# Patient Record
Sex: Female | Born: 1970 | Race: White | Hispanic: No | Marital: Married | Smoking: Never smoker
Health system: Southern US, Community
[De-identification: ages and names within clinical notes are randomized; demographics above are authoritative.]

## PROBLEM LIST (undated history)

## (undated) DIAGNOSIS — R011 Cardiac murmur, unspecified: Secondary | ICD-10-CM

## (undated) DIAGNOSIS — K8020 Calculus of gallbladder without cholecystitis without obstruction: Secondary | ICD-10-CM

## (undated) HISTORY — DX: Calculus of gallbladder without cholecystitis without obstruction: K80.20

## (undated) HISTORY — DX: Cardiac murmur, unspecified: R01.1

---

## 2002-12-20 ENCOUNTER — Other Ambulatory Visit: Admission: RE | Admit: 2002-12-20 | Discharge: 2002-12-20 | Payer: Self-pay | Admitting: Obstetrics and Gynecology

## 2004-01-16 ENCOUNTER — Other Ambulatory Visit: Admission: RE | Admit: 2004-01-16 | Discharge: 2004-01-16 | Payer: Self-pay | Admitting: Obstetrics and Gynecology

## 2004-07-28 ENCOUNTER — Inpatient Hospital Stay (HOSPITAL_COMMUNITY): Admission: AD | Admit: 2004-07-28 | Discharge: 2004-07-30 | Payer: Self-pay | Admitting: Obstetrics and Gynecology

## 2010-03-17 ENCOUNTER — Other Ambulatory Visit: Payer: Self-pay | Admitting: Obstetrics and Gynecology

## 2010-03-17 DIAGNOSIS — Z1239 Encounter for other screening for malignant neoplasm of breast: Secondary | ICD-10-CM

## 2010-04-08 ENCOUNTER — Ambulatory Visit
Admission: RE | Admit: 2010-04-08 | Discharge: 2010-04-08 | Disposition: A | Discharge status: Home / Self Care | Payer: BC Managed Care – PPO | Source: Ambulatory Visit | Attending: Obstetrics and Gynecology | Admitting: Obstetrics and Gynecology

## 2010-04-08 DIAGNOSIS — Z1239 Encounter for other screening for malignant neoplasm of breast: Secondary | ICD-10-CM

## 2010-04-08 IMAGING — MG MM DIGITAL SCREENING {BCG}
5 series · 5 of 5 positions shown · non-contrast
Comparison: none

DG SCREEN MAMMOGRAM BILATERAL
Bilateral CC and MLO view(s) were taken.

DIGITAL SCREENING MAMMOGRAM WITH CAD:
There are scattered fibroglandular densities.  A possible mass with calcification is noted in the 
left breast.  Spot compression views and possibly sonography are recommended for further 
evaluation.  The right breast is unremarkable.
Images were processed with CAD.

[R CC]
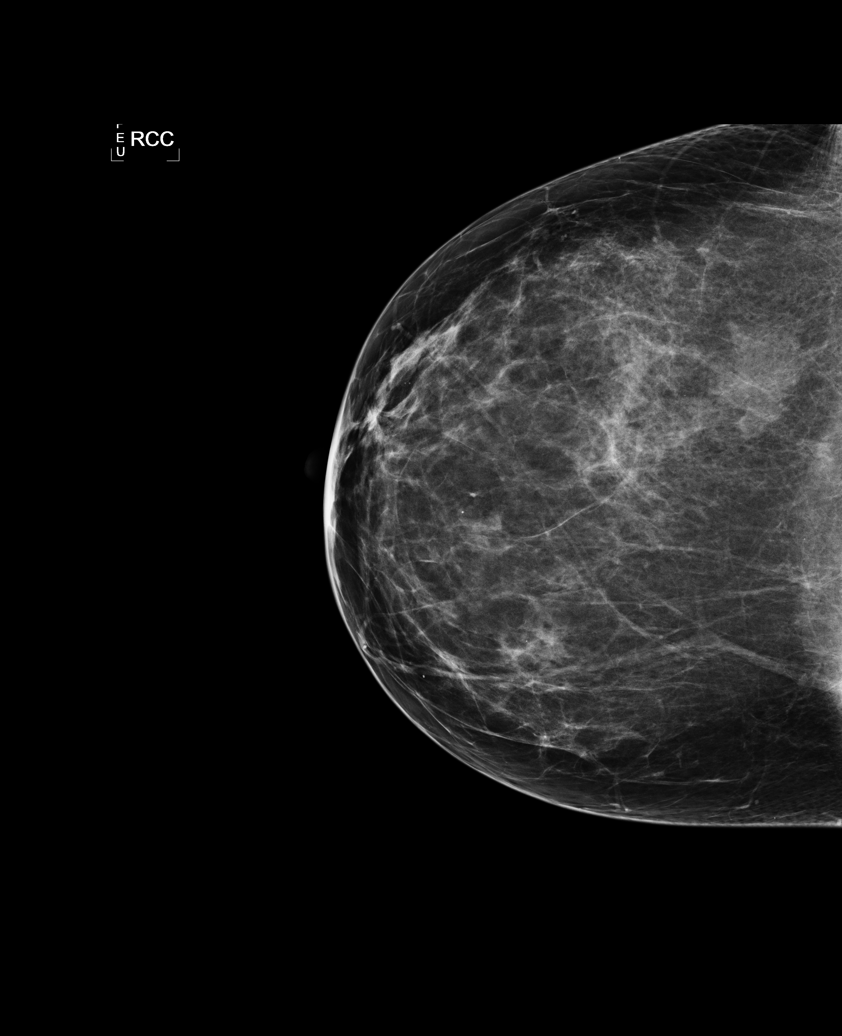

[L CC]
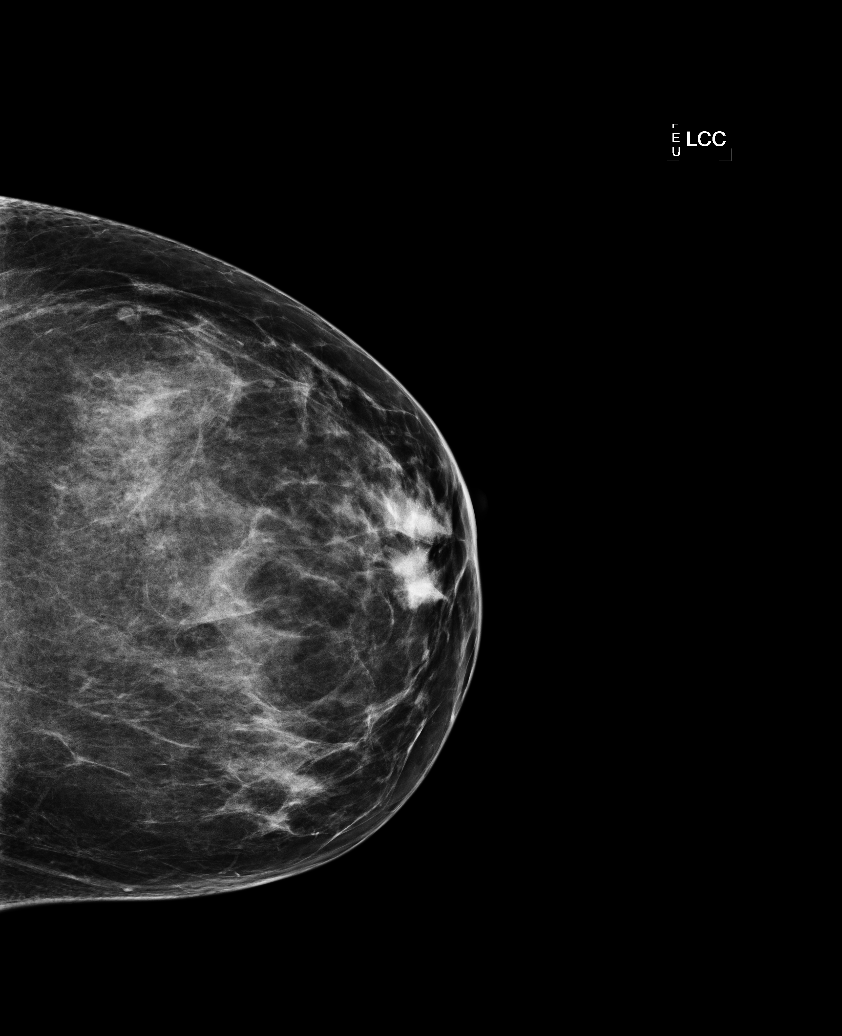

[L MLO]
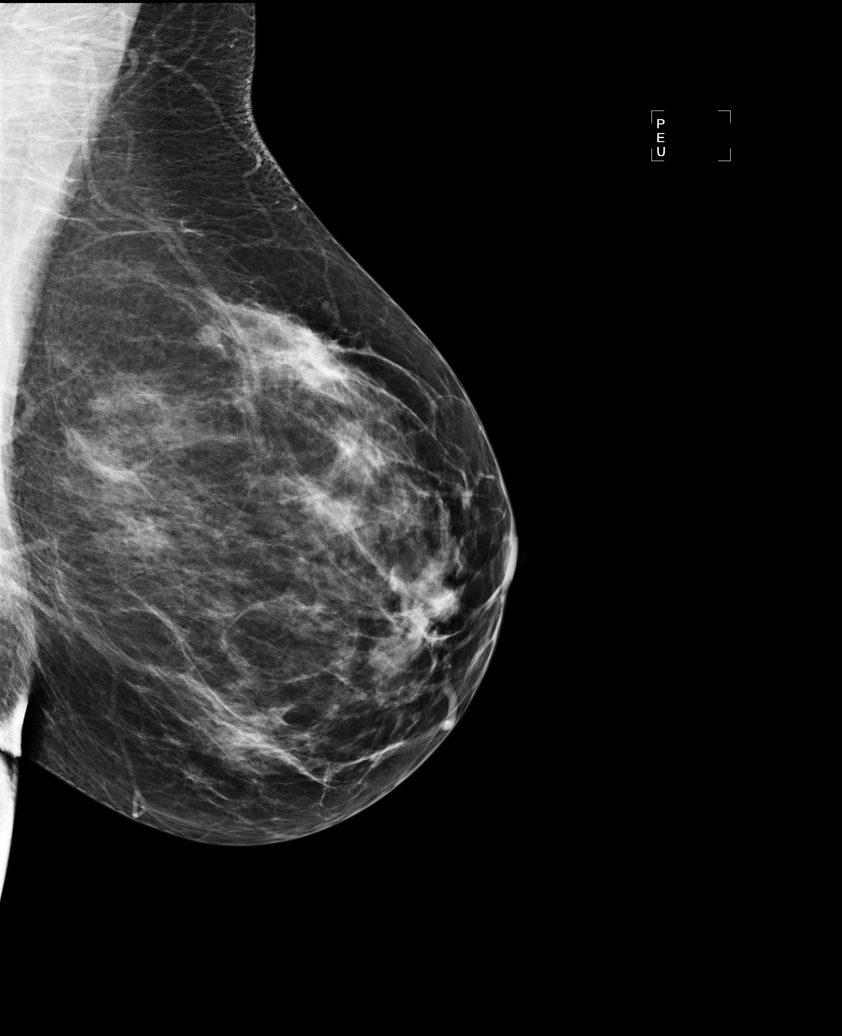

[R MLO (1 of 2)]
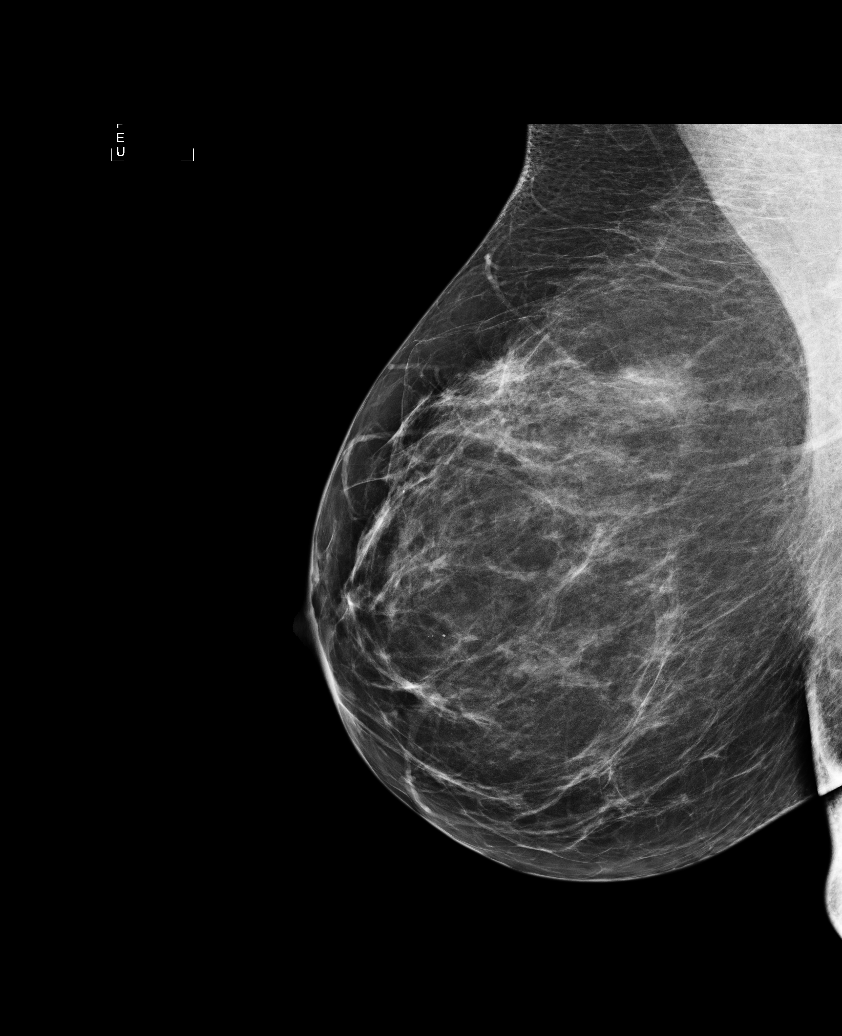

[R MLO (2 of 2)]
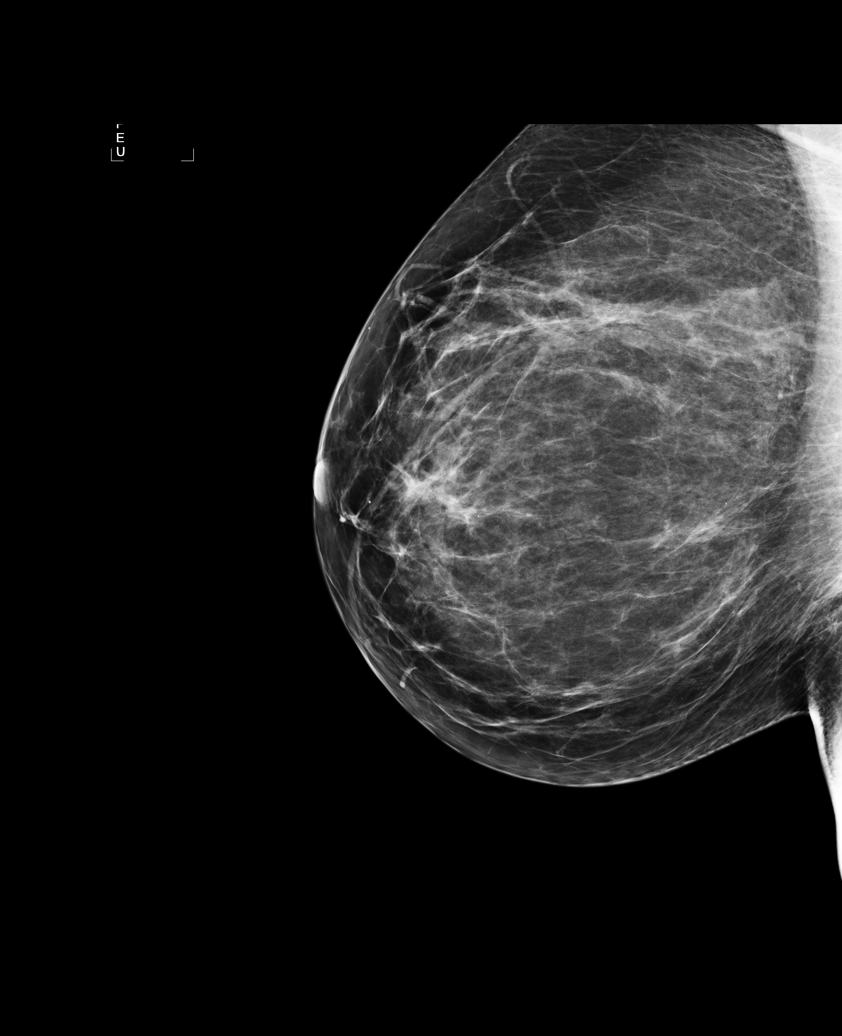

[5 of 5 positions shown; findings below may reference images not displayed]

IMPRESSION: Possible mass with calcification, left breast.  Additional evaluation is indicated.  The patient 
will be contacted for additional studies and a supplemental report will follow.

ASSESSMENT: Need additional imaging evaluation and/or prior mammograms for comparison - BI-RADS 0 -
Left

Further imaging of the left breast.
,

## 2010-04-09 ENCOUNTER — Other Ambulatory Visit: Payer: Self-pay | Admitting: Obstetrics and Gynecology

## 2010-04-09 DIAGNOSIS — R928 Other abnormal and inconclusive findings on diagnostic imaging of breast: Secondary | ICD-10-CM

## 2010-04-15 ENCOUNTER — Ambulatory Visit
Admission: RE | Admit: 2010-04-15 | Discharge: 2010-04-15 | Disposition: A | Discharge status: Home / Self Care | Payer: BC Managed Care – PPO | Source: Ambulatory Visit | Attending: Obstetrics and Gynecology | Admitting: Obstetrics and Gynecology

## 2010-04-15 DIAGNOSIS — R928 Other abnormal and inconclusive findings on diagnostic imaging of breast: Secondary | ICD-10-CM

## 2011-04-10 ENCOUNTER — Other Ambulatory Visit: Payer: Self-pay | Admitting: Obstetrics and Gynecology

## 2011-04-10 DIAGNOSIS — Z1231 Encounter for screening mammogram for malignant neoplasm of breast: Secondary | ICD-10-CM

## 2011-04-30 ENCOUNTER — Ambulatory Visit
Admission: RE | Admit: 2011-04-30 | Discharge: 2011-04-30 | Disposition: A | Discharge status: Home / Self Care | Payer: BC Managed Care – PPO | Source: Ambulatory Visit | Attending: Obstetrics and Gynecology | Admitting: Obstetrics and Gynecology

## 2011-04-30 DIAGNOSIS — Z1231 Encounter for screening mammogram for malignant neoplasm of breast: Secondary | ICD-10-CM

## 2011-08-14 ENCOUNTER — Encounter (INDEPENDENT_AMBULATORY_CARE_PROVIDER_SITE_OTHER): Payer: Self-pay

## 2011-09-07 ENCOUNTER — Ambulatory Visit (INDEPENDENT_AMBULATORY_CARE_PROVIDER_SITE_OTHER): Payer: BC Managed Care – PPO | Admitting: General Surgery

## 2011-10-02 ENCOUNTER — Encounter (INDEPENDENT_AMBULATORY_CARE_PROVIDER_SITE_OTHER): Payer: Self-pay | Admitting: General Surgery

## 2011-10-02 ENCOUNTER — Ambulatory Visit (INDEPENDENT_AMBULATORY_CARE_PROVIDER_SITE_OTHER): Payer: BC Managed Care – PPO | Admitting: General Surgery

## 2011-10-02 VITALS — BP 118/72 | HR 68 | Temp 96.9°F | Resp 16 | Ht 67.0 in | Wt 171.2 lb

## 2011-10-02 DIAGNOSIS — K8020 Calculus of gallbladder without cholecystitis without obstruction: Secondary | ICD-10-CM | POA: Insufficient documentation

## 2011-10-02 NOTE — Assessment & Plan Note (Addendum)
Pt with asymptomatic cholelithiasis.  Will observe.   Reviewed symptoms of gallbladder disease and advised pt to call if problems occur.   Pt had elevation of AST and ALT but this was independent of Alk Phos and TBili.   Given gallbladder educational material.   Pt is at slightly higher risk of needed cholecystectomy since both parents required it, but if completely asymptomatic, risks outweigh benefits of cholecystectomy.

## 2011-10-02 NOTE — Progress Notes (Signed)
Chief Complaint  Patient presents with  . Cholelithiasis    HISTORY: Patient is a 41 year old female who had an adverse drug reaction following dental surgery. She took hydrocodone and got palpitations, shakiness, and edginess. She had labs drawn at that time and was demonstrated to have an elevated AST and ALT. A right upper quadrant ultrasound was performed to evaluate the elevation in LFTs. No masses were seen and no biliary obstruction was seen.  She did however have small shadowing mobile gallstones.  She denies any history of abdominal pain, nausea, acholic stools, or dark urine. She has not experienced jaundice. She denies bloating and reflux. She denies any achiness in her upper abdomen. She denies fevers and chills. Of note, both her mother and her father required cholecystectomy for gallbladder disease.  Past Medical History  Diagnosis Date  . Gallstones   . Heart murmur     History reviewed. No pertinent past surgical history.  No current outpatient prescriptions on file.     No Known Allergies   Family History  Problem Relation Age of Onset  . Cancer Mother     breast     History   Social History  . Marital Status: Married    Spouse Name: N/A    Number of Children: N/A  . Years of Education: N/A   Social History Main Topics  . Smoking status: Never Smoker   . Smokeless tobacco: Never Used  . Alcohol Use: No  . Drug Use: No  . Sexually Active: None    REVIEW OF SYSTEMS - PERTINENT POSITIVES ONLY: 12 point review of systems negative other than HPI and PMH  EXAM: Filed Vitals:   10/02/11 1050  BP: 118/72  Pulse: 68  Temp: 96.9 F (36.1 C)  Resp: 16    Gen:  No acute distress.  Well nourished and well groomed.   Neurological: Alert and oriented to person, place, and time. Coordination normal.  Head: Normocephalic and atraumatic.  Eyes: Conjunctivae are normal. Pupils are equal, round, and reactive to light. No scleral icterus.  Neck: Normal  range of motion. Neck supple. No tracheal deviation or thyromegaly present.  Cardiovascular: Normal rate, regular rhythm, normal heart sounds and intact distal pulses.  Exam reveals no gallop and no friction rub.  No murmur heard. Respiratory: Effort normal.  No respiratory distress. No chest wall tenderness. Breath sounds normal.  No wheezes, rales or rhonchi.  GI: Soft. Bowel sounds are normal. The abdomen is soft and nontender.  There is no rebound and no guarding.  Musculoskeletal: Normal range of motion. Extremities are nontender.  Lymphadenopathy: No cervical, preauricular, postauricular or axillary adenopathy is present Skin: Skin is warm and dry. No rash noted. No diaphoresis. No erythema. No pallor. No clubbing, cyanosis, or edema.   Psychiatric: Normal mood and affect. Behavior is normal. Judgment and thought content normal.    LABORATORY RESULTS: Available labs are reviewed  Scanned in epic.    RADIOLOGY RESULTS: See E-Chart or I-Site for most recent results.  Images and reports are reviewed. Ultrasound with small shadowing gallstones without wall thickening or pericholecystic fluid.  No sonographic murphy's sign.     ASSESSMENT AND PLAN: Cholelithiasis Pt with asymptomatic cholelithiasis.  Will observe.   Reviewed symptoms of gallbladder disease and advised pt to call if problems occur.   Pt had elevation of AST and ALT but this was independent of Alk Phos and TBili.   Given gallbladder educational material.   Pt is at slightly higher risk  of needed cholecystectomy since both parents required it, but if completely asymptomatic, risks outweigh benefits of cholecystectomy.       Maudry Diego MD Surgical Oncology, General and Endocrine Surgery [facility]      Visit Diagnoses: 1. Cholelithiasis     Primary Care Physician: Herb Grays, MD

## 2011-10-02 NOTE — Patient Instructions (Signed)
Call for follow up appointment if you develop upper abdominal pain or nausea after eating.  Low fat diet decreases likelihood of gallbladder symptoms.

## 2012-05-31 ENCOUNTER — Other Ambulatory Visit: Payer: Self-pay

## 2012-05-31 DIAGNOSIS — Z1231 Encounter for screening mammogram for malignant neoplasm of breast: Secondary | ICD-10-CM

## 2012-06-27 ENCOUNTER — Ambulatory Visit
Admission: RE | Admit: 2012-06-27 | Discharge: 2012-06-27 | Disposition: A | Discharge status: Home / Self Care | Payer: BC Managed Care – PPO | Source: Ambulatory Visit

## 2012-06-27 DIAGNOSIS — Z1231 Encounter for screening mammogram for malignant neoplasm of breast: Secondary | ICD-10-CM

## 2013-05-29 ENCOUNTER — Other Ambulatory Visit: Payer: Self-pay

## 2013-05-29 DIAGNOSIS — Z1231 Encounter for screening mammogram for malignant neoplasm of breast: Secondary | ICD-10-CM

## 2013-07-07 ENCOUNTER — Encounter (INDEPENDENT_AMBULATORY_CARE_PROVIDER_SITE_OTHER): Payer: Self-pay

## 2013-07-07 ENCOUNTER — Ambulatory Visit
Admission: RE | Admit: 2013-07-07 | Discharge: 2013-07-07 | Disposition: A | Discharge status: Home / Self Care | Payer: BC Managed Care – PPO | Source: Ambulatory Visit

## 2013-07-07 DIAGNOSIS — Z1231 Encounter for screening mammogram for malignant neoplasm of breast: Secondary | ICD-10-CM

## 2014-06-01 ENCOUNTER — Other Ambulatory Visit: Payer: Self-pay

## 2014-06-01 DIAGNOSIS — Z1231 Encounter for screening mammogram for malignant neoplasm of breast: Secondary | ICD-10-CM

## 2014-07-10 ENCOUNTER — Ambulatory Visit
Admission: RE | Admit: 2014-07-10 | Discharge: 2014-07-10 | Disposition: A | Discharge status: Home / Self Care | Payer: BLUE CROSS/BLUE SHIELD | Source: Ambulatory Visit

## 2014-07-10 DIAGNOSIS — Z1231 Encounter for screening mammogram for malignant neoplasm of breast: Secondary | ICD-10-CM

## 2015-06-24 ENCOUNTER — Other Ambulatory Visit: Payer: Self-pay

## 2015-06-24 DIAGNOSIS — Z1231 Encounter for screening mammogram for malignant neoplasm of breast: Secondary | ICD-10-CM

## 2015-07-16 ENCOUNTER — Ambulatory Visit
Admission: RE | Admit: 2015-07-16 | Discharge: 2015-07-16 | Disposition: A | Discharge status: Home / Self Care | Payer: BLUE CROSS/BLUE SHIELD | Source: Ambulatory Visit

## 2015-07-16 DIAGNOSIS — Z1231 Encounter for screening mammogram for malignant neoplasm of breast: Secondary | ICD-10-CM

## 2016-06-09 ENCOUNTER — Other Ambulatory Visit: Payer: Self-pay | Admitting: Obstetrics and Gynecology

## 2016-06-09 DIAGNOSIS — Z1231 Encounter for screening mammogram for malignant neoplasm of breast: Secondary | ICD-10-CM

## 2016-07-22 ENCOUNTER — Ambulatory Visit
Admission: RE | Admit: 2016-07-22 | Discharge: 2016-07-22 | Disposition: A | Discharge status: Home / Self Care | Payer: BLUE CROSS/BLUE SHIELD | Source: Ambulatory Visit | Attending: Obstetrics and Gynecology | Admitting: Obstetrics and Gynecology

## 2016-07-22 DIAGNOSIS — Z1231 Encounter for screening mammogram for malignant neoplasm of breast: Secondary | ICD-10-CM

## 2017-07-19 ENCOUNTER — Other Ambulatory Visit: Payer: Self-pay | Admitting: Obstetrics and Gynecology

## 2017-07-19 DIAGNOSIS — Z1231 Encounter for screening mammogram for malignant neoplasm of breast: Secondary | ICD-10-CM

## 2017-08-10 ENCOUNTER — Ambulatory Visit
Admission: RE | Admit: 2017-08-10 | Discharge: 2017-08-10 | Disposition: A | Discharge status: Home / Self Care | Payer: BLUE CROSS/BLUE SHIELD | Source: Ambulatory Visit | Attending: Obstetrics and Gynecology | Admitting: Obstetrics and Gynecology

## 2017-08-10 DIAGNOSIS — Z1231 Encounter for screening mammogram for malignant neoplasm of breast: Secondary | ICD-10-CM

## 2017-08-10 IMAGING — MG DIGITAL SCREENING BILATERAL MAMMOGRAM WITH CAD
5 series · 5 of 5 positions shown · non-contrast
Comparison: Previous exam(s).

CLINICAL DATA: Screening.

EXAM:
DIGITAL SCREENING BILATERAL MAMMOGRAM WITH CAD

[L MLO (1 of 2)]
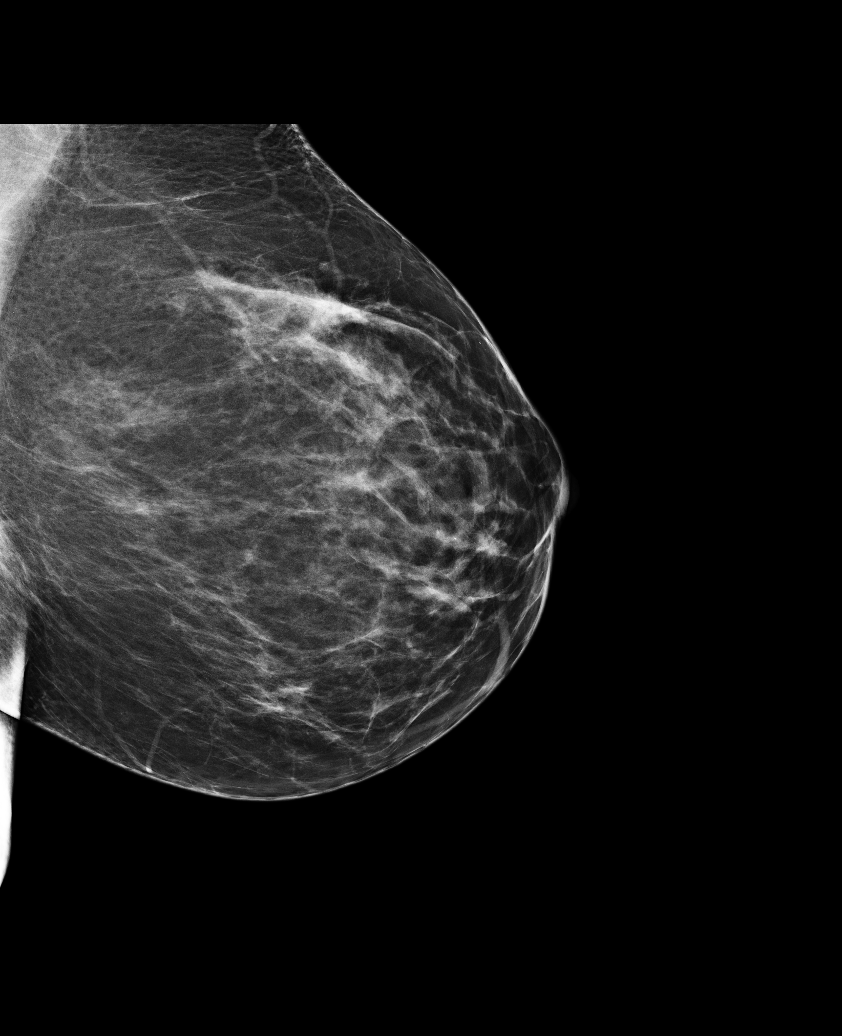

[R MLO]
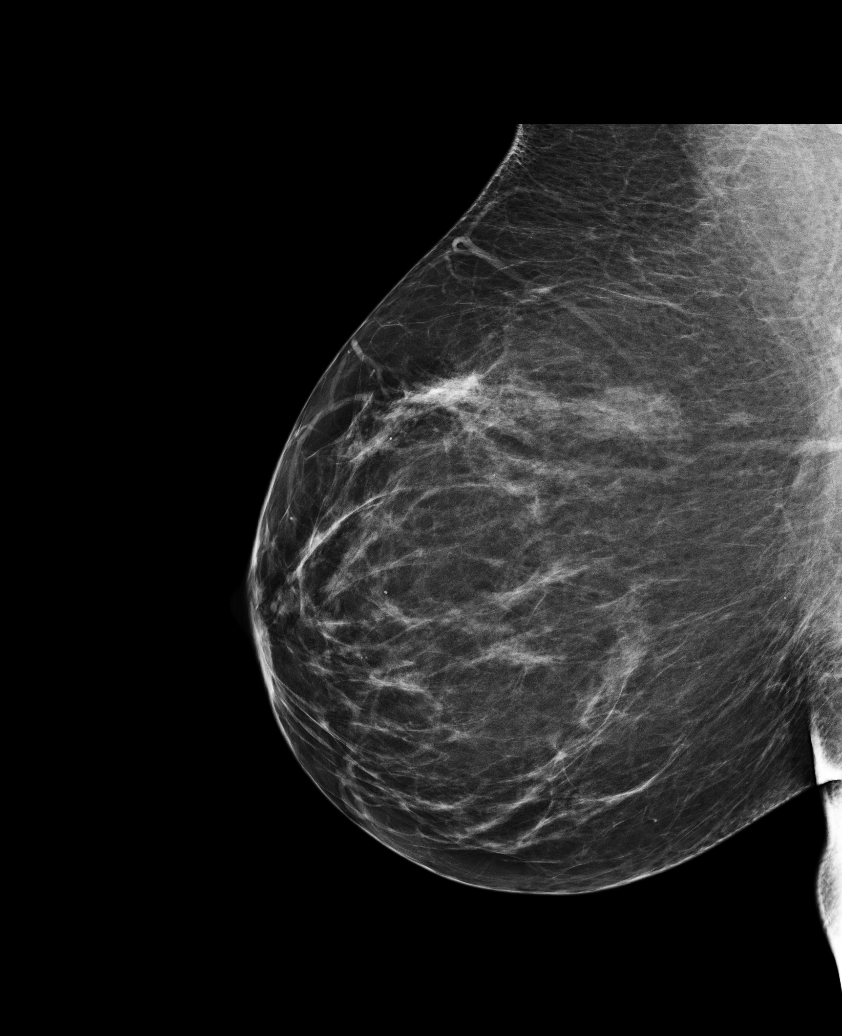

[R CC]
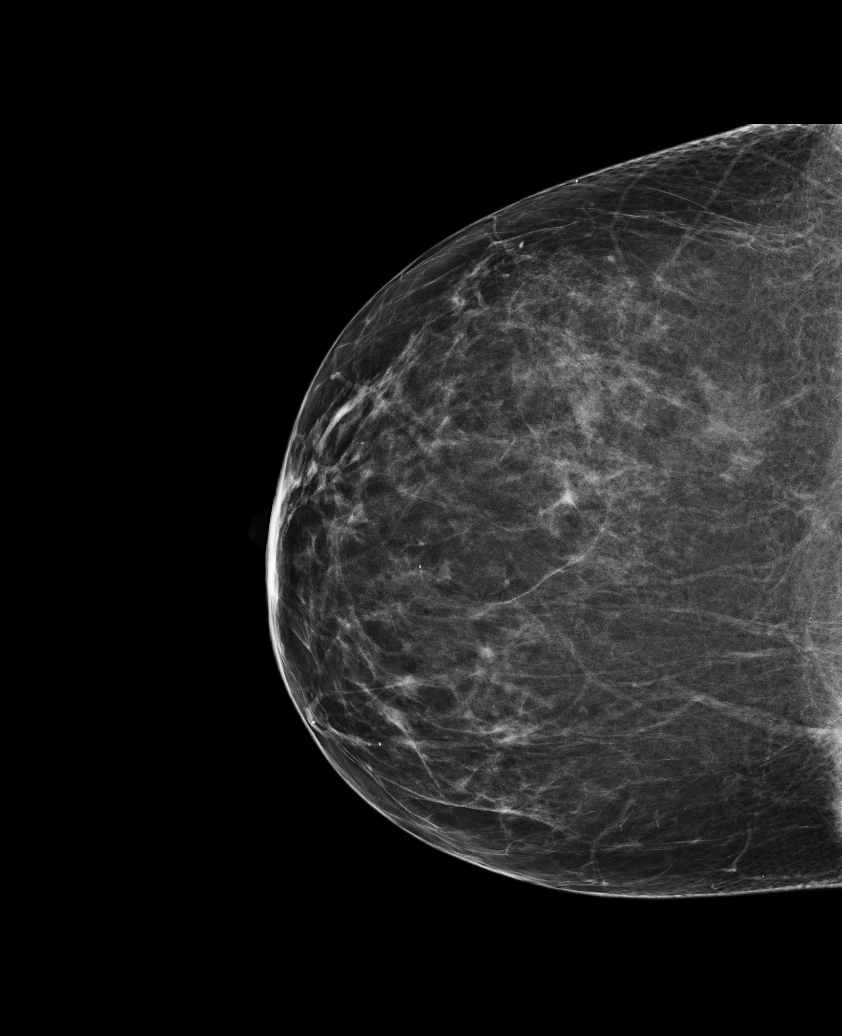

[L MLO (2 of 2)]
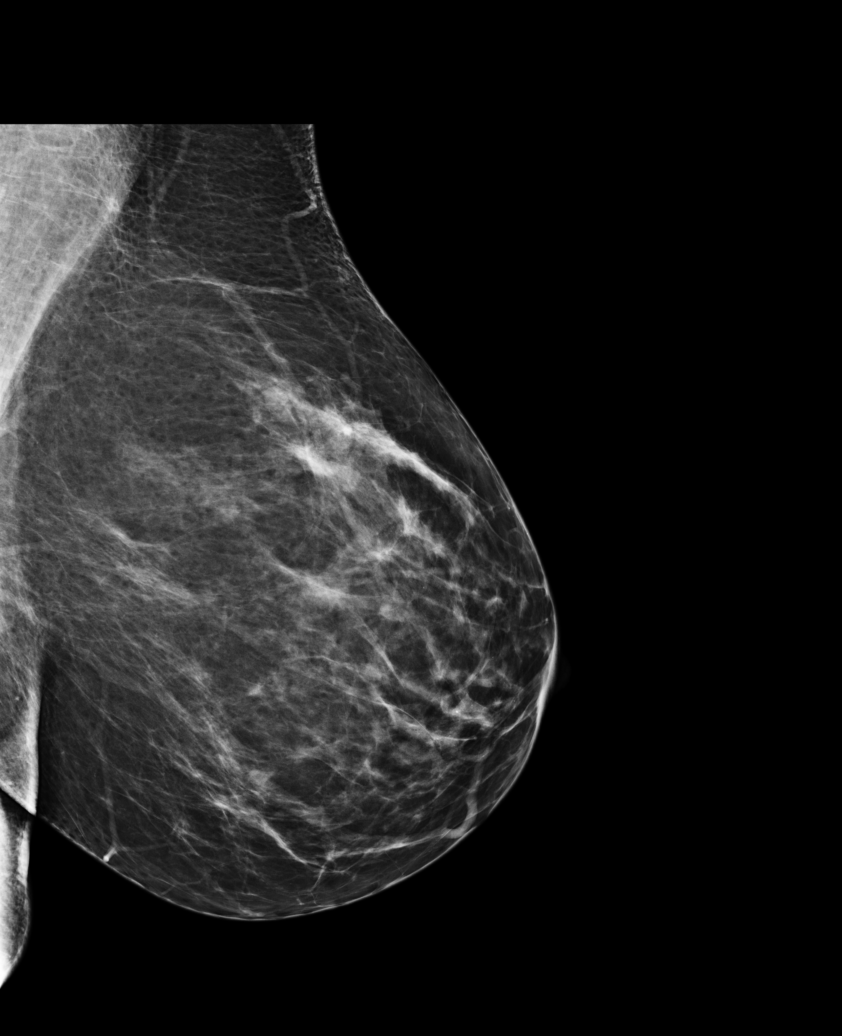

[L CC]
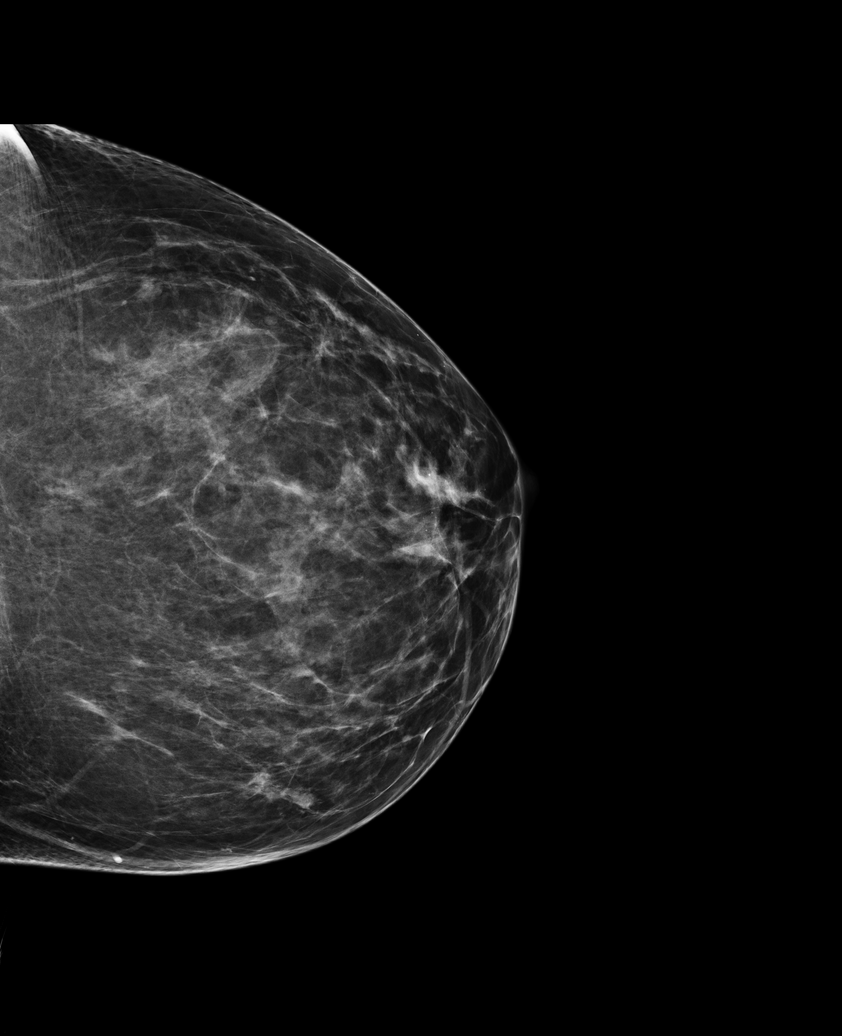

[5 of 5 positions shown; findings below may reference images not displayed]

ACR Breast Density Category b: There are scattered areas of
fibroglandular density.
FINDINGS: There are no findings suspicious for malignancy. Images were
processed with CAD.
IMPRESSION: No mammographic evidence of malignancy. A result letter of this
screening mammogram will be mailed directly to the patient.

RECOMMENDATION:
Screening mammogram in one year. (Code:[US])

BI-RADS CATEGORY  1: Negative.

## 2018-07-04 ENCOUNTER — Other Ambulatory Visit: Payer: Self-pay | Admitting: Obstetrics and Gynecology

## 2018-07-04 DIAGNOSIS — Z1231 Encounter for screening mammogram for malignant neoplasm of breast: Secondary | ICD-10-CM

## 2018-08-22 ENCOUNTER — Other Ambulatory Visit: Payer: Self-pay

## 2018-08-22 ENCOUNTER — Ambulatory Visit
Admission: RE | Admit: 2018-08-22 | Discharge: 2018-08-22 | Disposition: A | Discharge status: Home / Self Care | Payer: BLUE CROSS/BLUE SHIELD | Source: Ambulatory Visit | Attending: Obstetrics and Gynecology | Admitting: Obstetrics and Gynecology

## 2018-08-22 DIAGNOSIS — Z1231 Encounter for screening mammogram for malignant neoplasm of breast: Secondary | ICD-10-CM

## 2018-08-22 IMAGING — MG DIGITAL SCREENING BILATERAL MAMMOGRAM WITH CAD
4 series · 4 of 4 positions shown · non-contrast
Comparison: Previous exam(s).

CLINICAL DATA: Screening.

EXAM:
DIGITAL SCREENING BILATERAL MAMMOGRAM WITH CAD

[L CC]
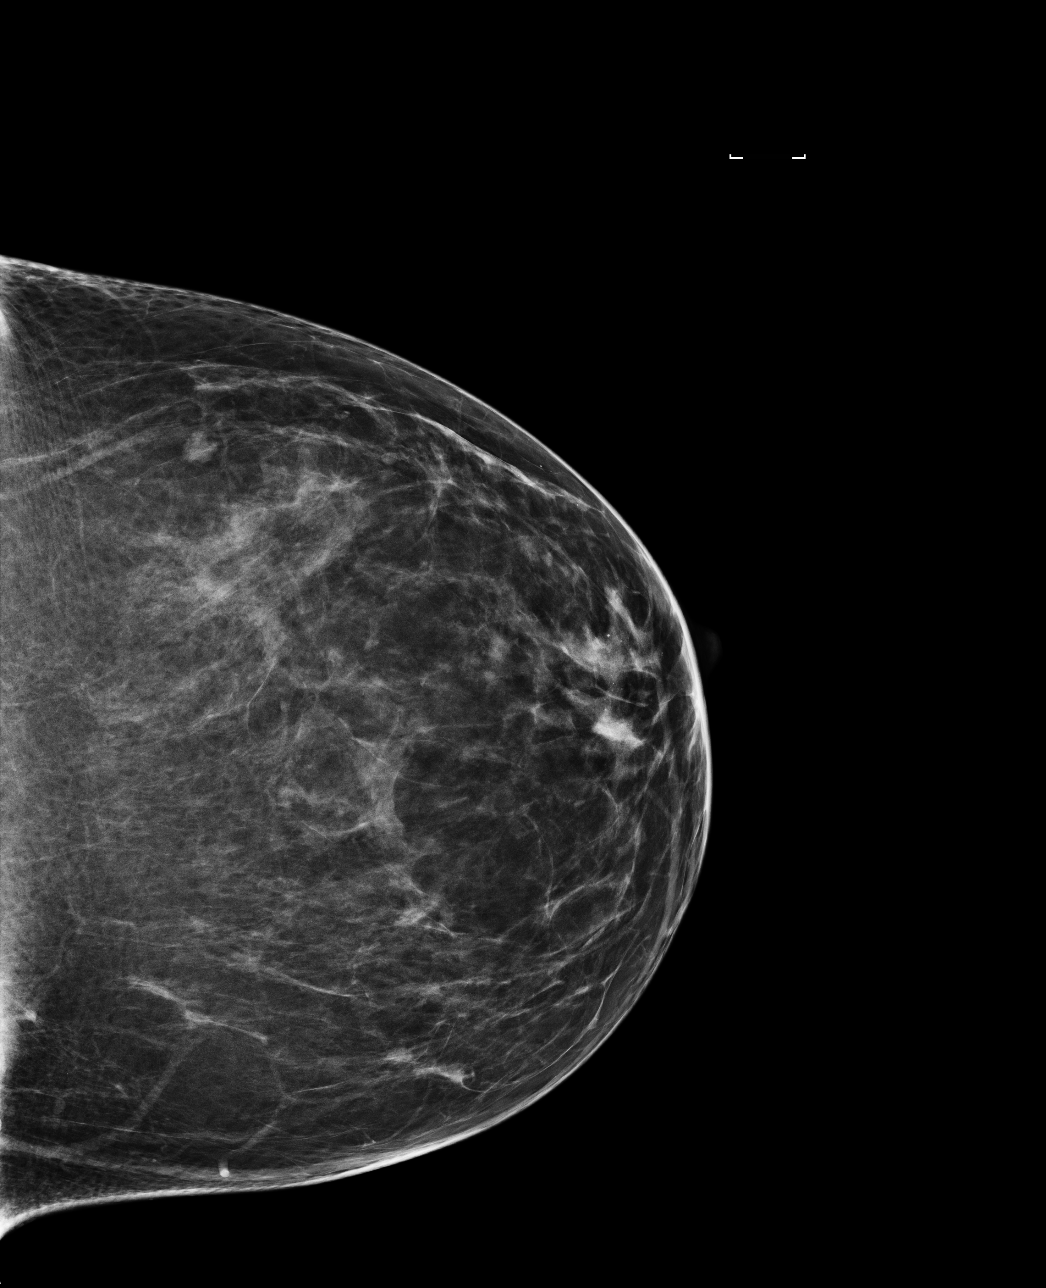

[R CC]
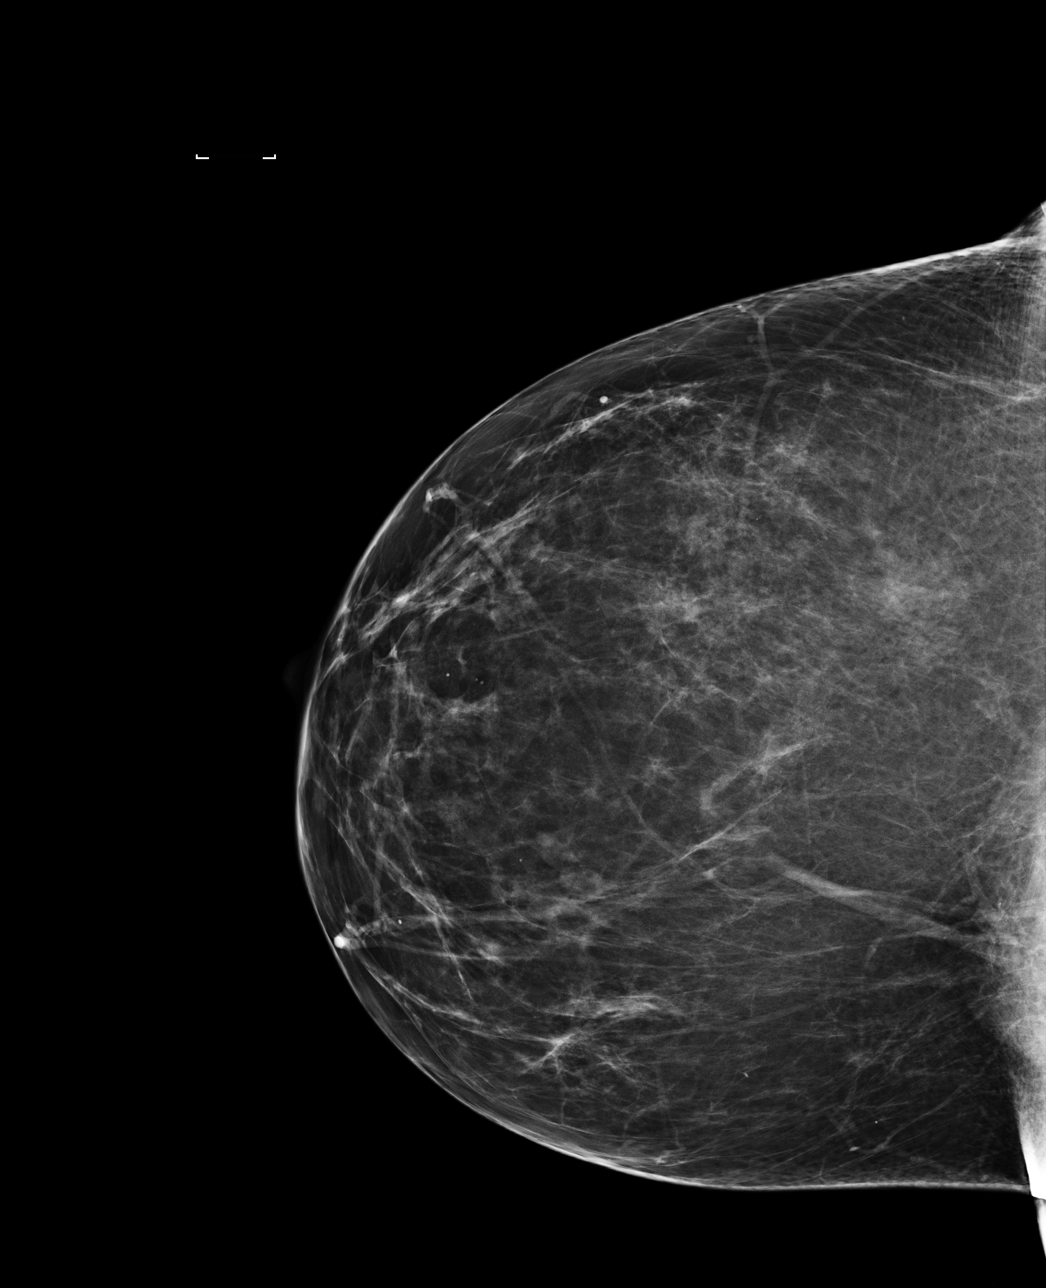

[R MLO]
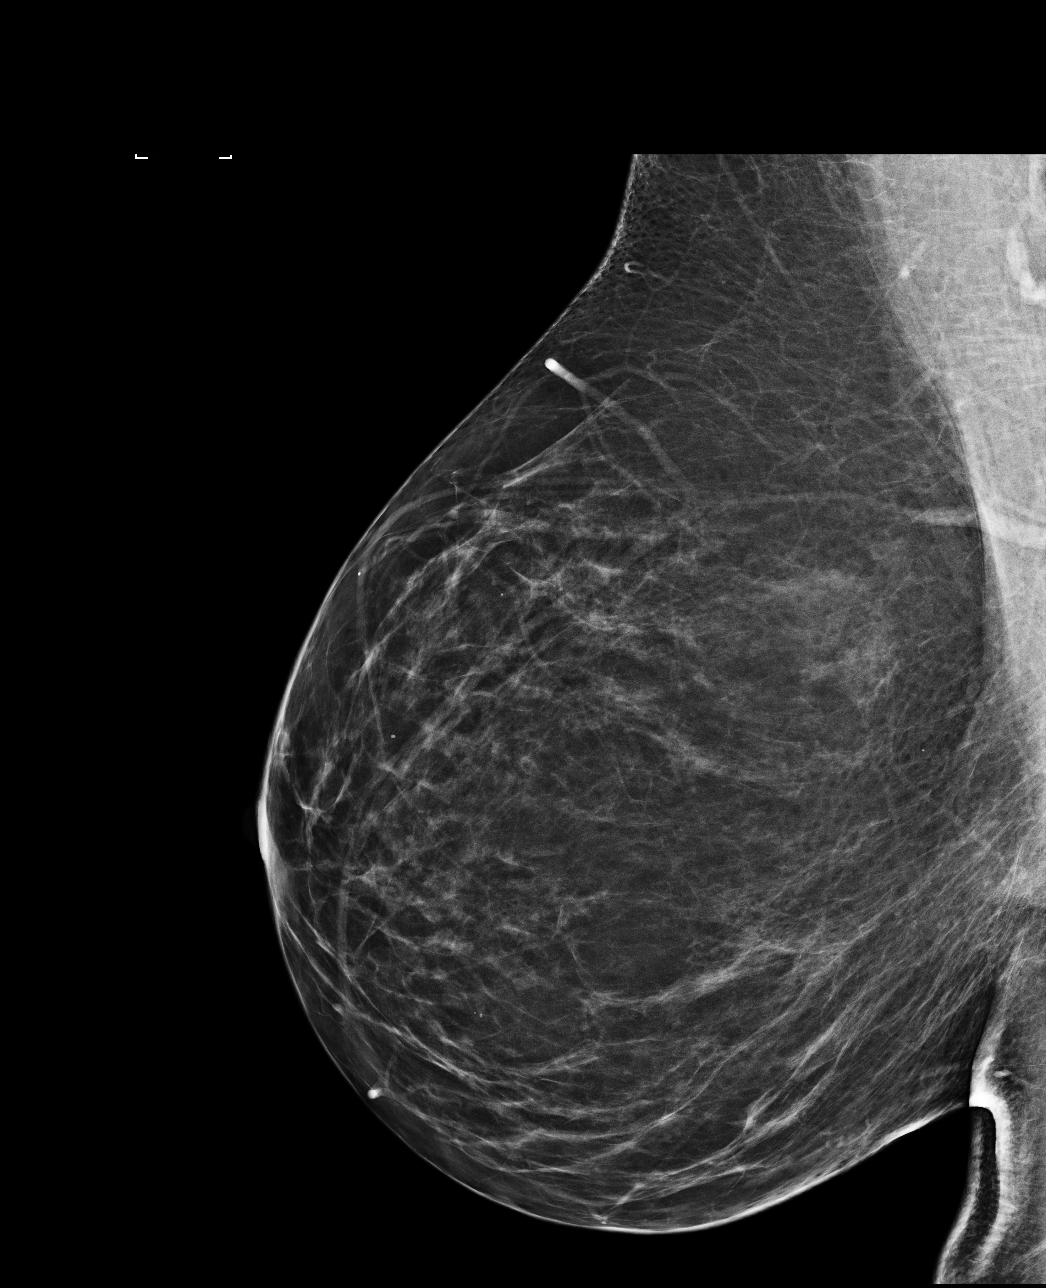

[L MLO]
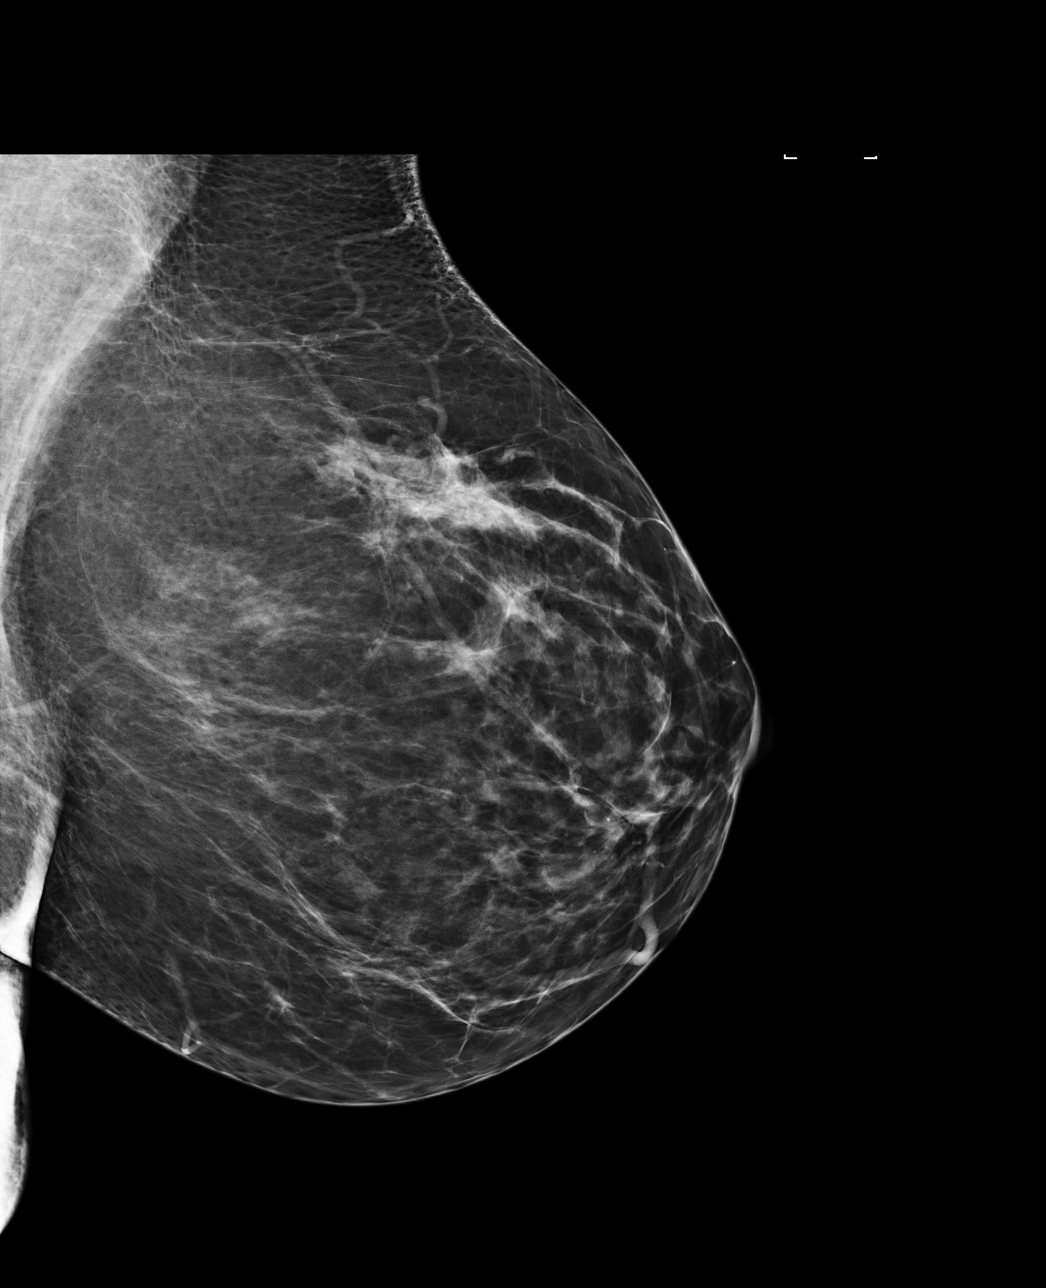

[4 of 4 positions shown; findings below may reference images not displayed]

ACR Breast Density Category b: There are scattered areas of
fibroglandular density.
FINDINGS: There are no findings suspicious for malignancy. Images were
processed with CAD.
IMPRESSION: No mammographic evidence of malignancy. A result letter of this
screening mammogram will be mailed directly to the patient.

RECOMMENDATION:
Screening mammogram in one year. (Code:[US])

BI-RADS CATEGORY  1: Negative.

## 2018-12-05 ENCOUNTER — Ambulatory Visit: Payer: BC Managed Care – PPO | Admitting: Professional

## 2018-12-21 ENCOUNTER — Ambulatory Visit (INDEPENDENT_AMBULATORY_CARE_PROVIDER_SITE_OTHER): Payer: PRIVATE HEALTH INSURANCE | Admitting: Psychology

## 2018-12-21 DIAGNOSIS — F411 Generalized anxiety disorder: Secondary | ICD-10-CM

## 2019-01-04 ENCOUNTER — Ambulatory Visit (INDEPENDENT_AMBULATORY_CARE_PROVIDER_SITE_OTHER): Payer: PRIVATE HEALTH INSURANCE | Admitting: Psychology

## 2019-01-04 DIAGNOSIS — F411 Generalized anxiety disorder: Secondary | ICD-10-CM

## 2019-01-18 ENCOUNTER — Ambulatory Visit (INDEPENDENT_AMBULATORY_CARE_PROVIDER_SITE_OTHER): Payer: PRIVATE HEALTH INSURANCE | Admitting: Psychology

## 2019-01-18 DIAGNOSIS — F411 Generalized anxiety disorder: Secondary | ICD-10-CM | POA: Diagnosis not present

## 2019-02-02 ENCOUNTER — Ambulatory Visit: Payer: PRIVATE HEALTH INSURANCE | Admitting: Psychology

## 2019-02-23 ENCOUNTER — Ambulatory Visit (INDEPENDENT_AMBULATORY_CARE_PROVIDER_SITE_OTHER): Payer: PRIVATE HEALTH INSURANCE | Admitting: Psychology

## 2019-02-23 DIAGNOSIS — F411 Generalized anxiety disorder: Secondary | ICD-10-CM | POA: Diagnosis not present

## 2019-03-17 ENCOUNTER — Ambulatory Visit (INDEPENDENT_AMBULATORY_CARE_PROVIDER_SITE_OTHER): Payer: PRIVATE HEALTH INSURANCE | Admitting: Psychology

## 2019-03-17 DIAGNOSIS — F411 Generalized anxiety disorder: Secondary | ICD-10-CM

## 2019-04-20 ENCOUNTER — Ambulatory Visit: Payer: PRIVATE HEALTH INSURANCE | Admitting: Psychology

## 2019-05-12 ENCOUNTER — Ambulatory Visit: Payer: BC Managed Care – PPO | Attending: Internal Medicine

## 2019-05-12 DIAGNOSIS — Z23 Encounter for immunization: Secondary | ICD-10-CM

## 2019-05-12 NOTE — Progress Notes (Signed)
   Covid-19 Vaccination Clinic  Name:  Meredith Dyer    MRN: [id number] DOB: 08-27-1970  05/12/2019  [name] was observed post Covid-19 immunization for 15 minutes without incident. She was provided with Vaccine Information Sheet and instruction to access the V-Safe system.   [name] was instructed to call 911 with any severe reactions post vaccine: Marland Kitchen Difficulty breathing  . Swelling of face and throat  . A fast heartbeat  . A bad rash all over body  . Dizziness and weakness   Immunizations Administered    Name Date Dose VIS Date Route   Pfizer COVID-19 Vaccine 05/12/2019  2:51 PM 0.3 mL 01/27/2019 Intramuscular   Manufacturer: [employer], [employer]   Lot: [id number]   NDC: [address]-6

## 2019-06-06 ENCOUNTER — Ambulatory Visit: Payer: BC Managed Care – PPO | Attending: Internal Medicine

## 2019-06-06 DIAGNOSIS — Z23 Encounter for immunization: Secondary | ICD-10-CM

## 2019-06-06 NOTE — Progress Notes (Signed)
   Covid-19 Vaccination Clinic  Name:  [name]    MRN: [id number] DOB: 1970/11/14  06/06/2019  [name] was observed post Covid-19 immunization for 15 minutes without incident. She was provided with Vaccine Information Sheet and instruction to access the V-Safe system.   [name] was instructed to call 911 with any severe reactions post vaccine: Marland Kitchen Difficulty breathing  . Swelling of face and throat  . A fast heartbeat  . A bad rash all over body  . Dizziness and weakness   Immunizations Administered    Name Date Dose VIS Date Route   Pfizer COVID-19 Vaccine 06/06/2019 10:23 AM 0.3 mL 04/12/2018 Intramuscular   Manufacturer: [employer], [employer]   Lot: [id number]   NDC: [address]-3

## 2019-07-24 ENCOUNTER — Other Ambulatory Visit: Payer: Self-pay | Admitting: Obstetrics and Gynecology

## 2019-07-24 DIAGNOSIS — Z1231 Encounter for screening mammogram for malignant neoplasm of breast: Secondary | ICD-10-CM

## 2019-08-24 ENCOUNTER — Other Ambulatory Visit: Payer: Self-pay

## 2019-08-24 ENCOUNTER — Ambulatory Visit
Admission: RE | Admit: 2019-08-24 | Discharge: 2019-08-24 | Disposition: A | Discharge status: Home / Self Care | Payer: BC Managed Care – PPO | Source: Ambulatory Visit | Attending: Obstetrics and Gynecology | Admitting: Obstetrics and Gynecology

## 2019-08-24 DIAGNOSIS — Z1231 Encounter for screening mammogram for malignant neoplasm of breast: Secondary | ICD-10-CM

## 2019-08-24 IMAGING — MG DIGITAL SCREENING BILAT W/ CAD
6 series · 6 of 6 positions shown · non-contrast
Comparison: Previous exam(s).

CLINICAL DATA: Screening.

EXAM:
DIGITAL SCREENING BILATERAL MAMMOGRAM WITH CAD

[L CC]
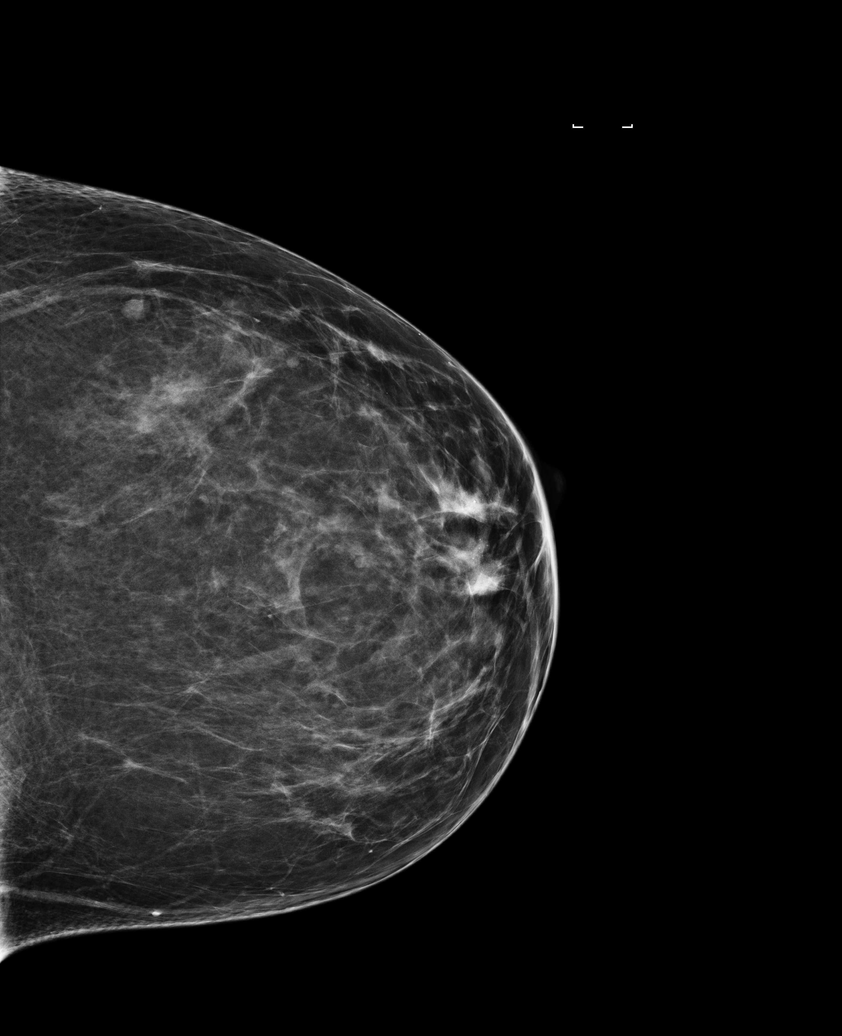

[R CC]
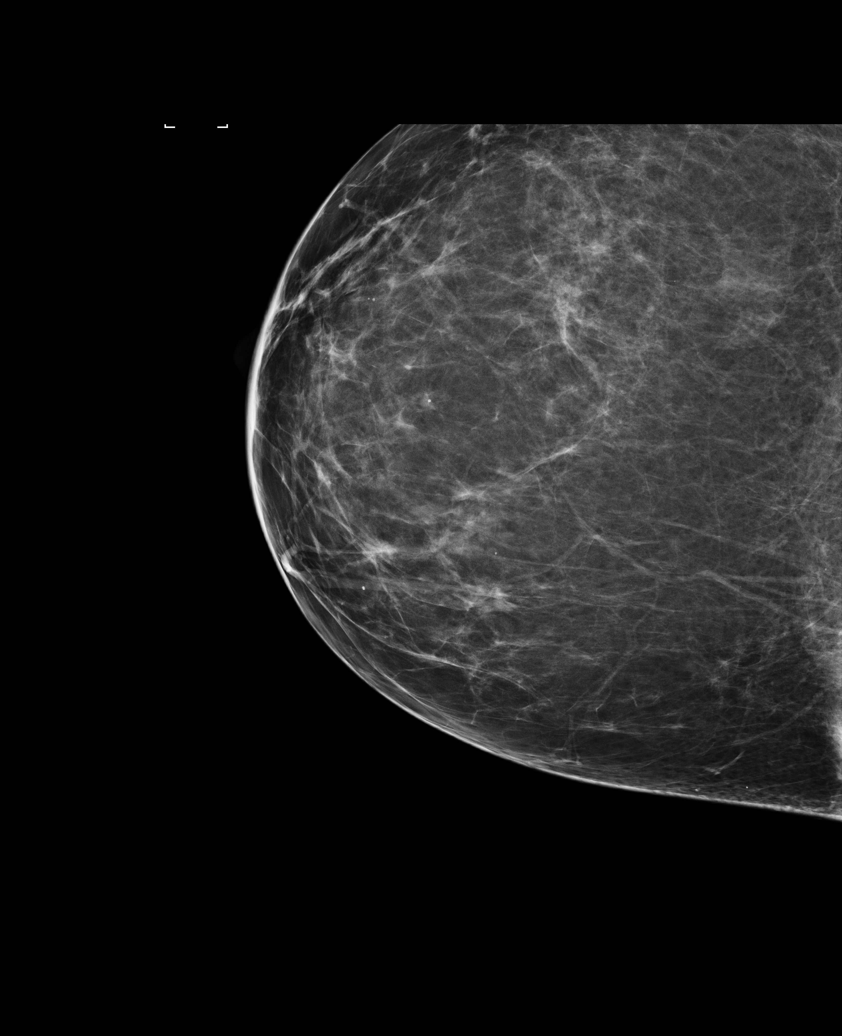

[R XCCL]
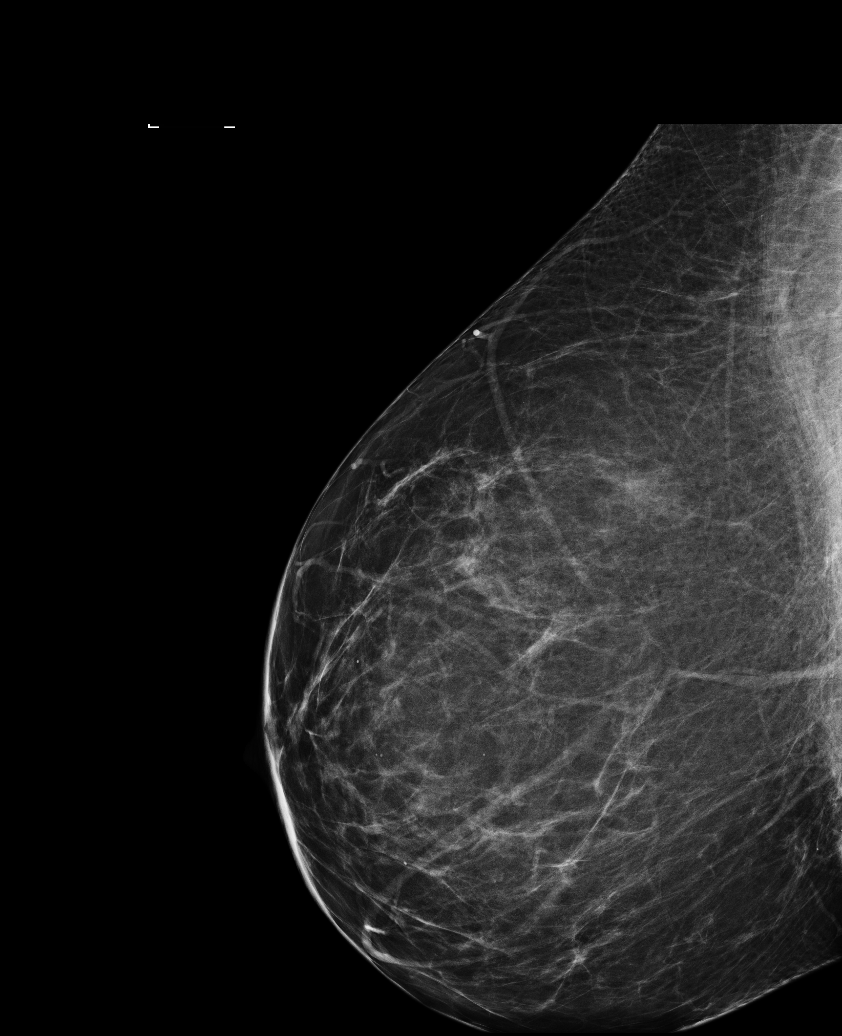

[L XCCL]
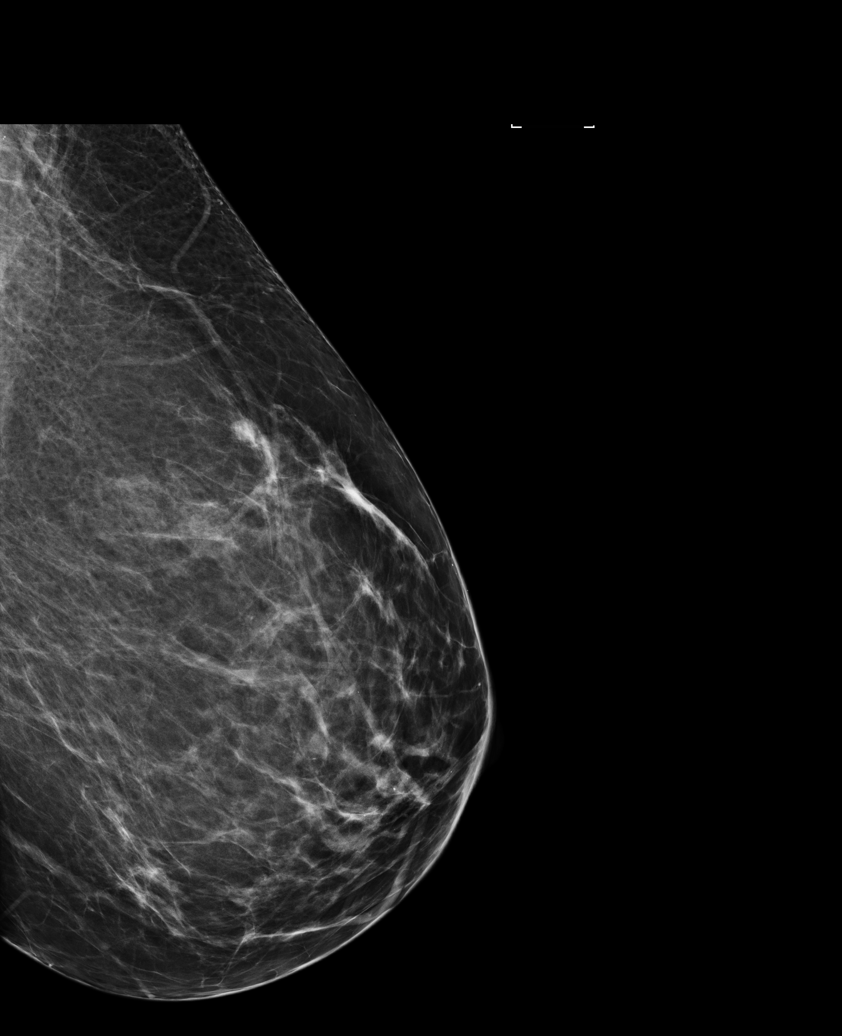

[R MLO]
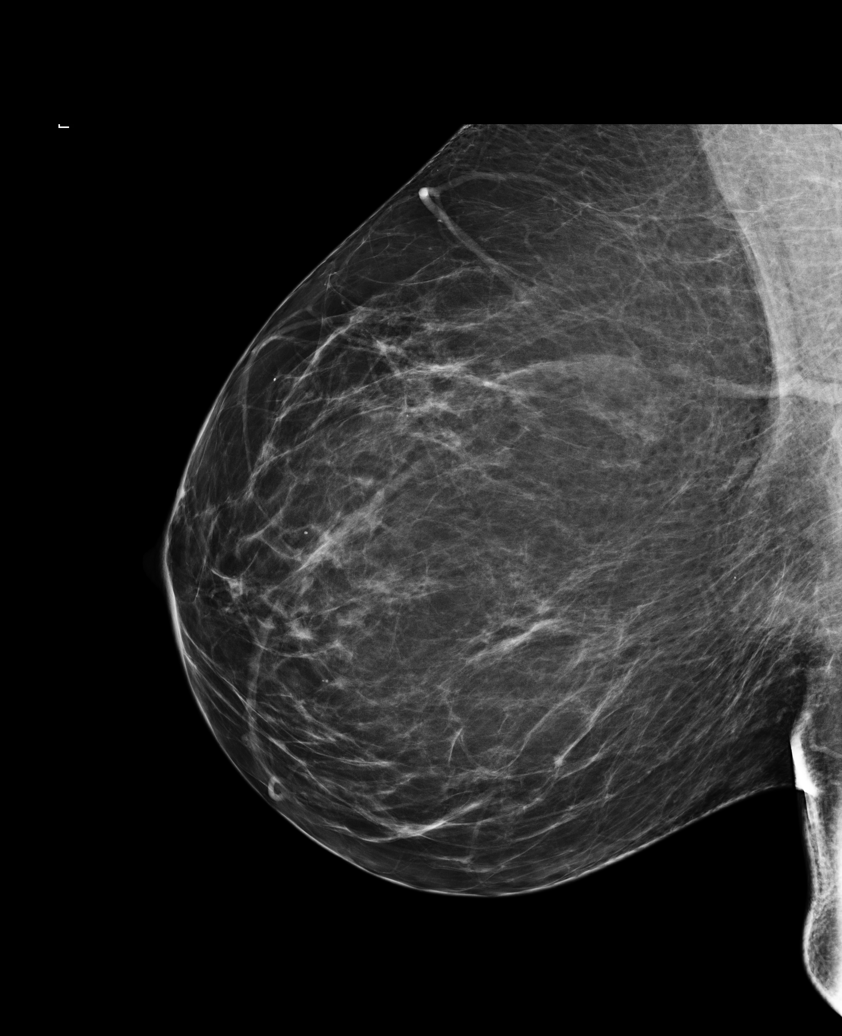

[L MLO]
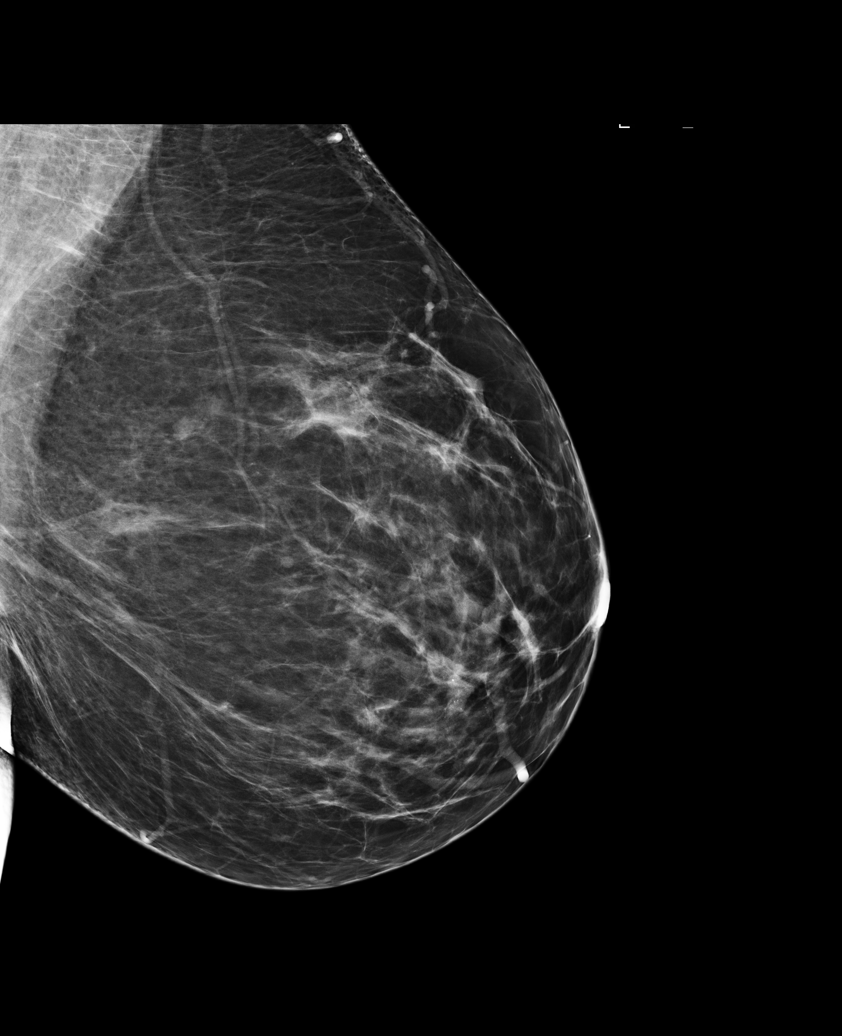

[6 of 6 positions shown; findings below may reference images not displayed]

ACR Breast Density Category b: There are scattered areas of
fibroglandular density.
FINDINGS: There are no findings suspicious for malignancy. Images were
processed with CAD.
IMPRESSION: No mammographic evidence of malignancy. A result letter of this
screening mammogram will be mailed directly to the patient.

RECOMMENDATION:
Screening mammogram in one year. (Code:[US])

BI-RADS CATEGORY  1: Negative.

## 2020-01-25 ENCOUNTER — Ambulatory Visit (INDEPENDENT_AMBULATORY_CARE_PROVIDER_SITE_OTHER): Payer: BC Managed Care – PPO | Admitting: Podiatry

## 2020-01-25 ENCOUNTER — Other Ambulatory Visit: Payer: Self-pay

## 2020-01-25 ENCOUNTER — Encounter: Payer: Self-pay | Admitting: Podiatry

## 2020-01-25 ENCOUNTER — Ambulatory Visit (INDEPENDENT_AMBULATORY_CARE_PROVIDER_SITE_OTHER): Payer: BC Managed Care – PPO

## 2020-01-25 DIAGNOSIS — M76822 Posterior tibial tendinitis, left leg: Secondary | ICD-10-CM

## 2020-01-25 DIAGNOSIS — M7750 Other enthesopathy of unspecified foot: Secondary | ICD-10-CM

## 2020-01-25 NOTE — Progress Notes (Signed)
  Subjective:  Patient ID: Meredith Dyer, female    DOB: 03-25-1970,  MRN: [id number] HPI Chief Complaint  Patient presents with  . Ankle Pain    Medial ankle left - aching, swelling x 5 years, she's been seeing another doc and he only gives injections, wants 2nd opinion and other options  . New Patient (Initial Visit)    49 y.o. female presents with the above complaint.   ROS: Denies fever chills nausea vomiting muscle aches pains calf pain back pain chest pain shortness of breath.  Past Medical History:  Diagnosis Date  . Gallstones   . Heart murmur    No past surgical history on file.  Current Outpatient Medications:  .  escitalopram (LEXAPRO) 10 MG tablet, Take 10 mg by mouth daily., Disp: , Rfl:   No Known Allergies Review of Systems Objective:  There were no vitals filed for this visit.  General: Well developed, nourished, in no acute distress, alert and oriented x3   Dermatological: Skin is warm, dry and supple bilateral. Nails x 10 are well maintained; remaining integument appears unremarkable at this time. There are no open sores, no preulcerative lesions, no rash or signs of infection present.  Vascular: Dorsalis Pedis artery and Posterior Tibial artery pedal pulses are 2/4 bilateral with immedate capillary fill time. Pedal hair growth present. No varicosities and no lower extremity edema present bilateral.   Neruologic: Grossly intact via light touch bilateral. Vibratory intact via tuning fork bilateral. Protective threshold with Semmes Wienstein monofilament intact to all pedal sites bilateral. Patellar and Achilles deep tendon reflexes 2+ bilateral. No Babinski or clonus noted bilateral.   Musculoskeletal: No gross boney pedal deformities bilateral. No pain, crepitus, or limitation noted with foot and ankle range of motion bilateral. Muscular strength 5/5 in all groups tested bilateral.  She has pain on palpation medial aspect of the medial malleolus just inferior  to it.  Posterior tibial tendon is weak but appears to be intact.  Exquisitely tender in that area.  Gait: Unassisted, Nonantalgic.    Radiographs:  Radiographs taken today demonstrate an osseously mature individual with severe pes planovalgus.  She does have thickening of soft tissue and the tendon margins are obscured on the medial aspect of the foot with edema.  No acute findings are noted.  Assessment & Plan:   Assessment: Chronic pain posterior tibial tendon failure of conservative therapies including injections and booting and orthotics have failed to alleviate her symptoms.  Plan: At this point I feel is necessary for her to have an MRI of that medial ankle I suspect a tear of the posterior tibial tendon this is generally not traumatic in nature and her previous doctor had injected the area many times.     Alexiah Koroma T. [place], [place]

## 2020-01-25 NOTE — Addendum Note (Signed)
Addended by: Lottie Rater E on: 01/25/2020 12:13 PM   Modules accepted: Orders

## 2020-01-26 ENCOUNTER — Other Ambulatory Visit: Payer: Self-pay | Admitting: Podiatry

## 2020-01-26 DIAGNOSIS — M76822 Posterior tibial tendinitis, left leg: Secondary | ICD-10-CM

## 2020-02-06 ENCOUNTER — Ambulatory Visit
Admission: RE | Admit: 2020-02-06 | Discharge: 2020-02-06 | Disposition: A | Discharge status: Home / Self Care | Payer: BC Managed Care – PPO | Source: Ambulatory Visit | Attending: Podiatry | Admitting: Podiatry

## 2020-02-06 ENCOUNTER — Other Ambulatory Visit: Payer: Self-pay

## 2020-02-06 DIAGNOSIS — M76822 Posterior tibial tendinitis, left leg: Secondary | ICD-10-CM

## 2020-02-06 IMAGING — MR MR ANKLE*L* W/O CM
4 of 5 series · 17 of 40 positions shown · non-contrast
Comparison: None.

CLINICAL DATA: Ankle pain. Evaluate posterior tibial tendon. Trauma
7 years ago.

EXAM:
MRI OF THE LEFT ANKLE WITHOUT CONTRAST
TECHNIQUE: Multiplanar, multisequence MR imaging of the ankle was performed. No
intravenous contrast was administered.

[Series 3: PD fat-sat · axial · left · 3.0mm · 0.31mm/px · z∈[-85,+31]mm · 8 of 35 slices shown]
[im 1/35]
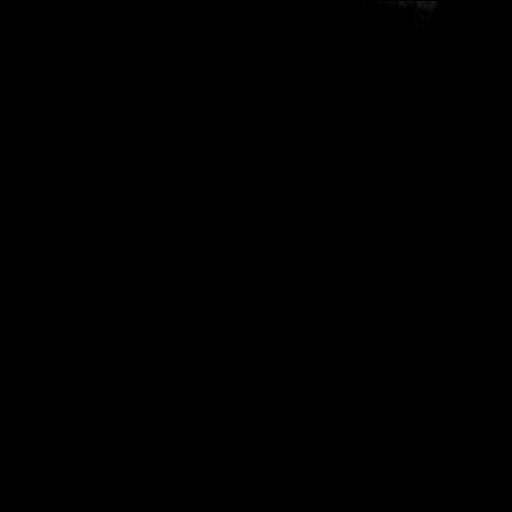
[im 5/35]
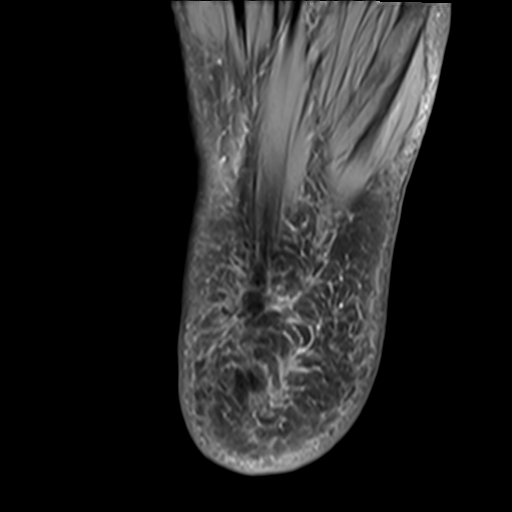
[im 9/35]
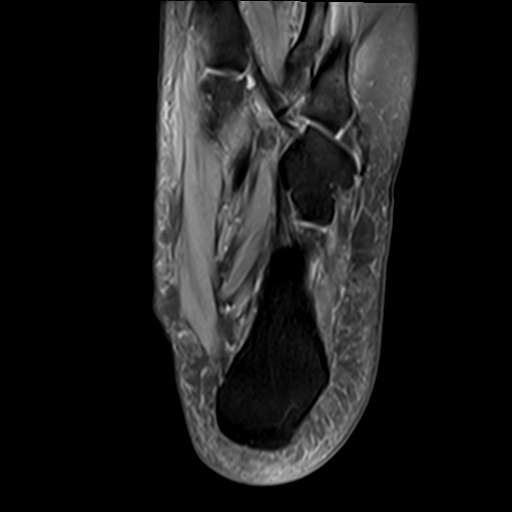
[im 13/35]
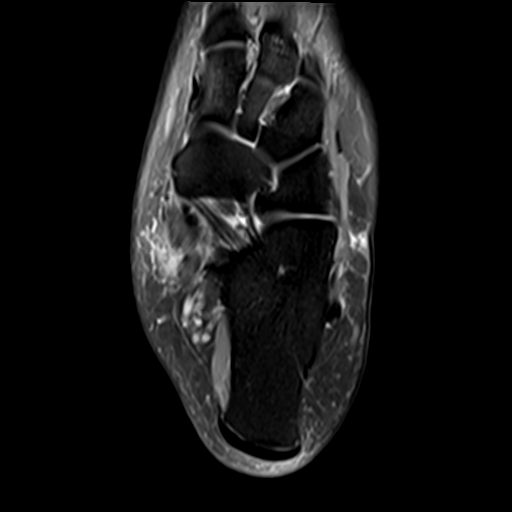
[im 18/35]
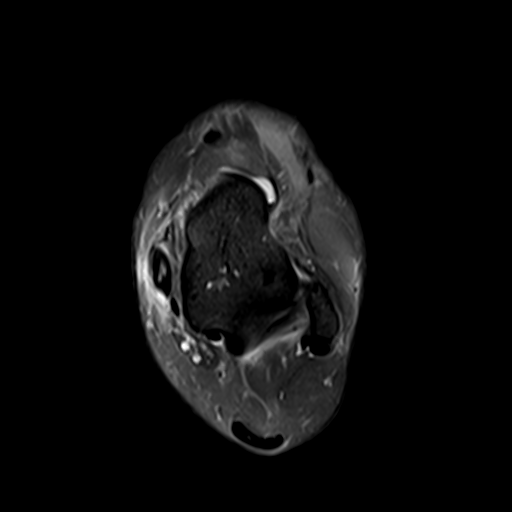
[im 22/35]
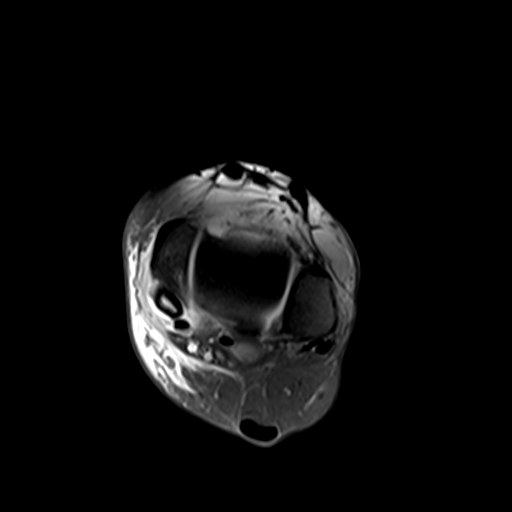
[im 26/35]
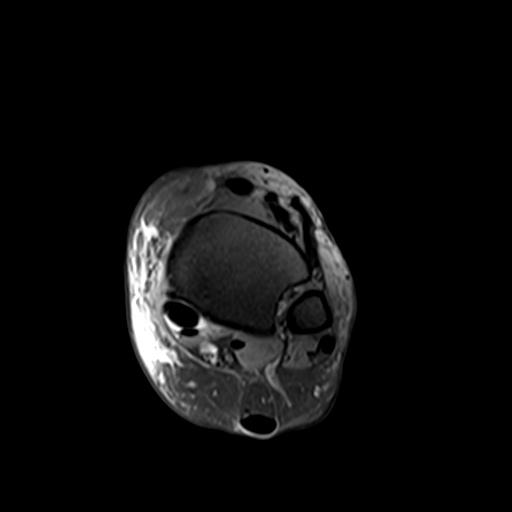
[im 30/35]
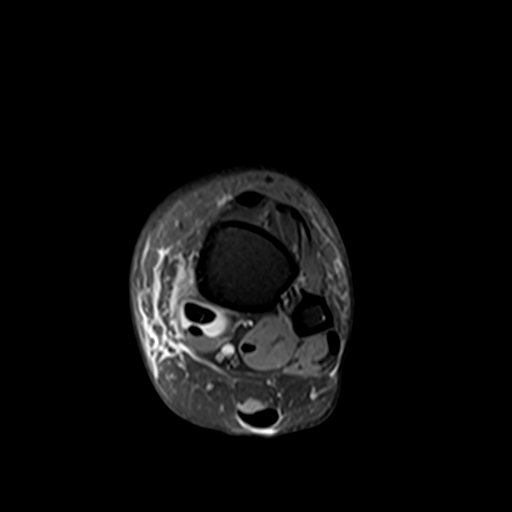

[Series 4: T2 fat-sat · axial · left · 3.0mm · 0.31mm/px · z∈[-69,+31]mm · 3 of 35 slices shown (1 of 2)]
[im 5/35]
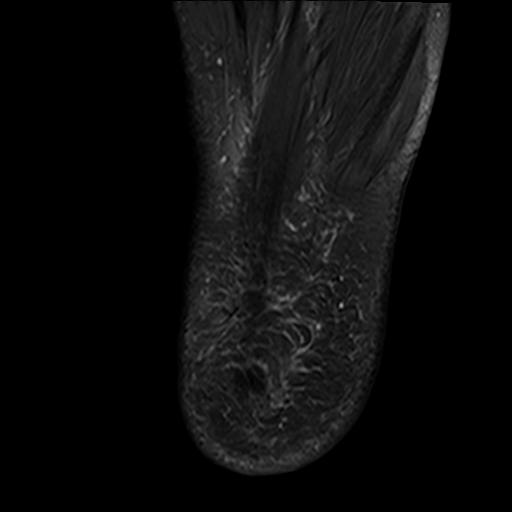
[im 18/35]
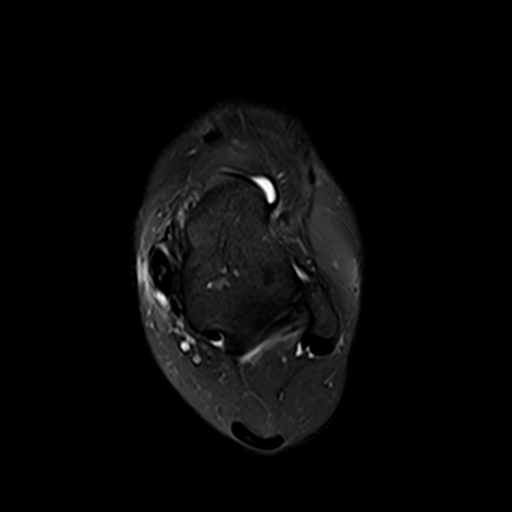
[im 30/35]
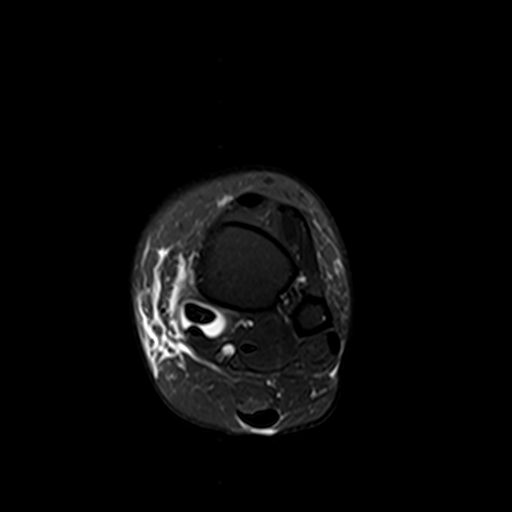

[Series 5: T1 · sagittal · left · 4.0mm · 0.27mm/px · 3 of 24 slices shown]
[im 5/24]
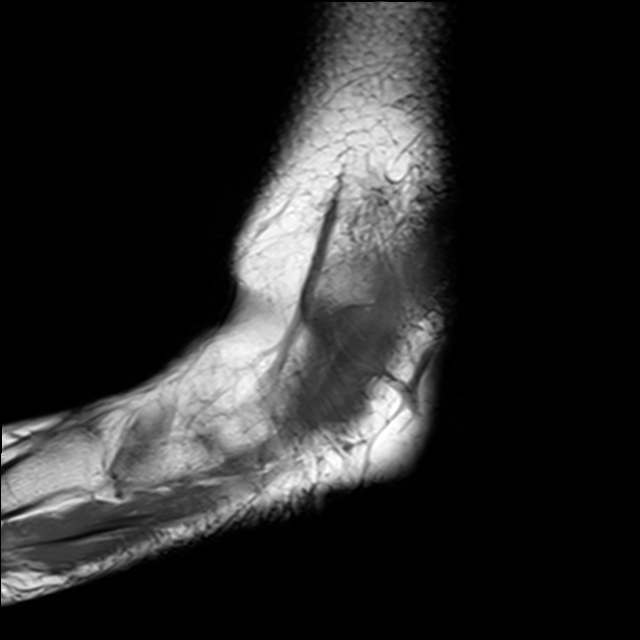
[im 14/24]
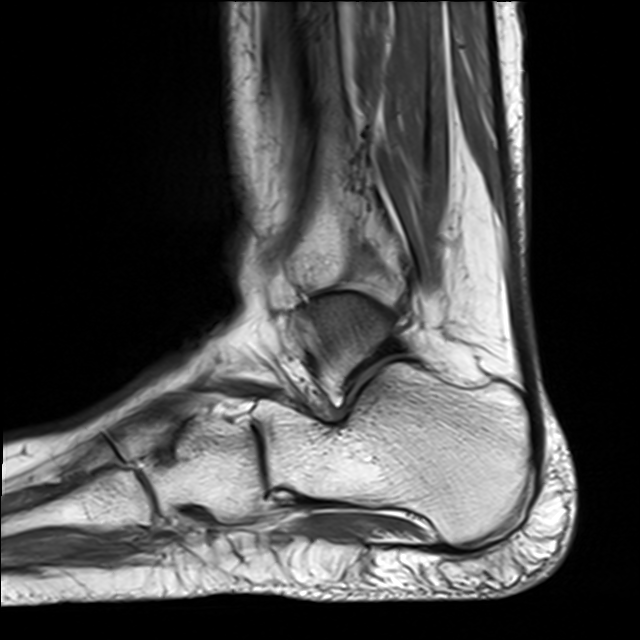
[im 24/24]
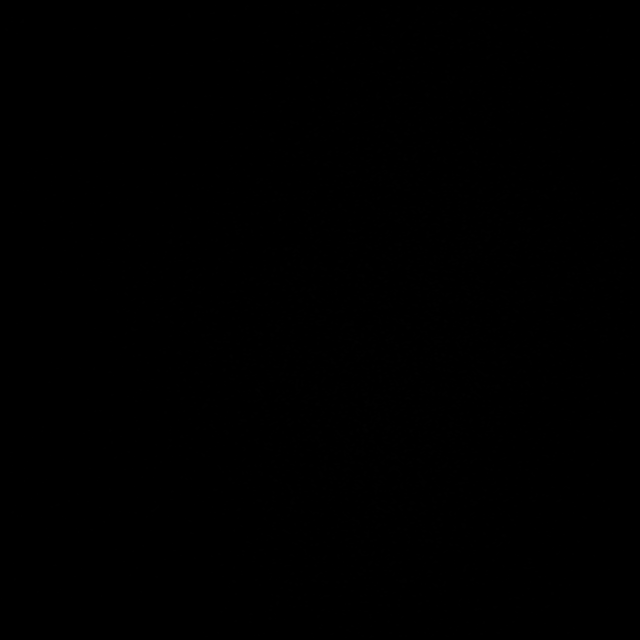

[Series 7: T2 fat-sat · coronal · left · 3.0mm · 0.31mm/px · 3 of 39 slices shown (2 of 2)]
[im 5/39]
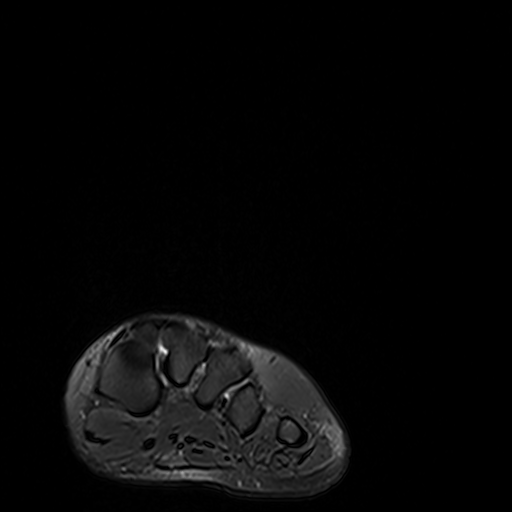
[im 22/39]
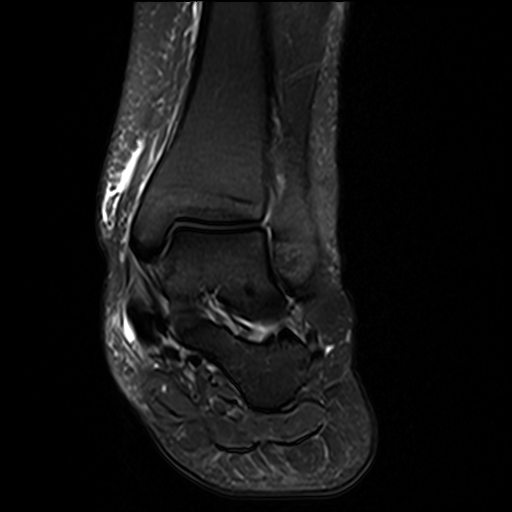
[im 34/39]
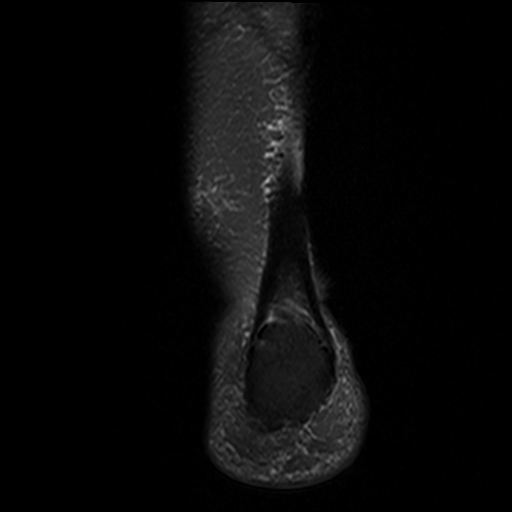

[17 of 40 positions shown; findings below may reference images not displayed]

FINDINGS: TENDONS

Peroneal: Peroneal longus tendon intact. Peroneal brevis intact.

Posteromedial: Severe tendinosis of the posterior tibial tendon with
a interstitial tear and tenosynovitis. Flexor hallucis longus tendon
intact. Flexor digitorum longus tendon intact.

Anterior: Tibialis anterior tendon intact. Extensor hallucis longus
tendon intact Extensor digitorum longus tendon intact.

Achilles:  Intact.

Plantar Fascia: Intact.

LIGAMENTS

Lateral: Anterior talofibular ligament intact. Calcaneofibular
ligament intact. Posterior talofibular ligament intact. Anterior and
posterior tibiofibular ligaments intact.

Medial: Deltoid ligament intact. Spring ligament intact.

CARTILAGE

Ankle Joint: No joint effusion. Normal ankle mortise. No chondral
defect.

Subtalar Joints/Sinus Tarsi: Normal subtalar joints. No subtalar
joint effusion. Normal sinus tarsi.

Bones: No marrow signal abnormality. No fracture or dislocation.
Mild osteoarthritis of the talonavicular joint.

Soft Tissue: No fluid collection or hematoma. Muscles are normal
without edema or atrophy. Tarsal tunnel is normal.
IMPRESSION: 1. Severe tendinosis of the posterior tibial tendon with a
interstitial tear and tenosynovitis.

## 2020-02-07 ENCOUNTER — Telehealth: Payer: Self-pay

## 2020-02-07 NOTE — Telephone Encounter (Signed)
Called patient -   Informed her of MRI results-advised to wear boot until she sees Dr. Al Corpus for a surgical consult and I would have a scheduler give her a call for that.

## 2020-02-07 NOTE — Telephone Encounter (Signed)
-----   Message from Elinor Parkinson, [place] sent at 02/07/2020  7:23 AM EST ----- Posterior tibial tendon tear like we thought.  Needs to be in short cam boot until we can get an appointment to see me.  This will have to be fixed surgically most likely.

## 2020-02-07 NOTE — Telephone Encounter (Signed)
Pt see her results from her MRI in Mychart. The pt would like to know what to do next. Please advise.

## 2020-02-07 NOTE — Telephone Encounter (Signed)
We just got the results this morning and I have responded but Morrie Sheldon has not had time to call yet.  Thanks.

## 2020-02-14 NOTE — Telephone Encounter (Signed)
Lvm for patient to call and schedule an appointment for sx consult

## 2020-03-05 ENCOUNTER — Other Ambulatory Visit: Payer: Self-pay

## 2020-03-05 ENCOUNTER — Ambulatory Visit (INDEPENDENT_AMBULATORY_CARE_PROVIDER_SITE_OTHER): Payer: BC Managed Care – PPO | Admitting: Podiatry

## 2020-03-05 ENCOUNTER — Encounter: Payer: Self-pay | Admitting: Podiatry

## 2020-03-05 DIAGNOSIS — M66872 Spontaneous rupture of other tendons, left ankle and foot: Secondary | ICD-10-CM | POA: Diagnosis not present

## 2020-03-05 NOTE — Progress Notes (Signed)
She presents today for follow-up of her MRI states that the foot really has not gotten any better she refers to her left foot.  States that it is unknown at long is still very very painful.  Objective: Vital signs are stable she is alert and oriented x3 she has a tear of the posterior tibial tendon near its insertion at the navicular tuberosity left foot.  This is based on the MRI.  She has pain on palpation medial aspect of the foot and ankle.  Particularly the majority the pain is around the posterior tibial tendon.  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  Plan: Discussed etiology pathology and surgical therapies at this point time we consented her for a Kidner procedure with a possible flexor digitorum longus transfer and a cast.  She understands this is amenable to that we did discuss the possible postop complications which may include but not limited to postop pain bleeding swelling infection and recurrence.  She also understands the chance for blood clots.  She is on the pages of the consent form today we sent her to [facility] for scheduling.  We did provide her with information regarding the surgery center and the anesthesia group and I will follow-up with her at the time of surgery.

## 2020-04-02 ENCOUNTER — Telehealth: Payer: Self-pay | Admitting: Podiatry

## 2020-04-02 NOTE — Telephone Encounter (Signed)
DOS 04-12-20 REPAIR TENDON PT LEFT [id number] REPAIR ANGULAR DEFORMITY LEFT [place] TENDON LEFT [id number] CAST LEFT  BCBS ELIGIBILITY: 02/16/2017 - Present  Deductible: $4,000 $210 Met $3790 Remain  Out of Pocket: $6,000 $157 Paid $5843 Remain  No CoPay  100% Coinsurance   Per [employer] No Prior Authorization is Required.

## 2020-04-11 ENCOUNTER — Other Ambulatory Visit: Payer: Self-pay | Admitting: Podiatry

## 2020-04-11 MED ORDER — OXYCODONE-ACETAMINOPHEN 10-325 MG PO TABS: 1.0000 | ORAL_TABLET | Freq: Three times a day (TID) | ORAL | 0 refills | Status: AC | PRN

## 2020-04-11 MED ORDER — ONDANSETRON HCL 4 MG PO TABS: 4.0000 mg | ORAL_TABLET | Freq: Three times a day (TID) | ORAL | 0 refills | Status: DC | PRN

## 2020-04-11 MED ORDER — CEPHALEXIN 500 MG PO CAPS: 500.0000 mg | ORAL_CAPSULE | Freq: Three times a day (TID) | ORAL | 0 refills | Status: DC

## 2020-04-12 DIAGNOSIS — M66872 Spontaneous rupture of other tendons, left ankle and foot: Secondary | ICD-10-CM

## 2020-04-18 ENCOUNTER — Other Ambulatory Visit: Payer: Self-pay

## 2020-04-18 ENCOUNTER — Encounter: Payer: Self-pay | Admitting: Podiatry

## 2020-04-18 ENCOUNTER — Ambulatory Visit (INDEPENDENT_AMBULATORY_CARE_PROVIDER_SITE_OTHER): Payer: BC Managed Care – PPO | Admitting: Podiatry

## 2020-04-18 VITALS — BP 113/81 | HR 105 | Temp 97.6°F

## 2020-04-18 DIAGNOSIS — Z9889 Other specified postprocedural states: Secondary | ICD-10-CM

## 2020-04-18 DIAGNOSIS — M66872 Spontaneous rupture of other tendons, left ankle and foot: Secondary | ICD-10-CM

## 2020-04-18 DIAGNOSIS — M76822 Posterior tibial tendinitis, left leg: Secondary | ICD-10-CM

## 2020-04-18 NOTE — Progress Notes (Signed)
She presents today for postop visit date of surgery 04/12/2020 Kidner procedure posterior tibial tendon repair with flexor digitorum transfer possible.  She presents with her cast today clean and dry.  She is using a knee scooter.  Denies fever chills nausea vomiting muscle aches and pain states that she has had some trouble taking the narcotic with constipation.  She has not tried combination of Tylenol and ibuprofen.  Good sensation to the toes in the cast is not tight proximally.  She has good range of motion of the toes as well.  Assessment: Well-healing surgical foot x1 week.  Date of surgery 04/12/2020 left foot.  Plan: At this point we discussed her surgery in great detail today explaining to her that I did not have to go as far as true Kidner procedure other than just repairing the posterior tibial tendon she understands this and is satisfied with this.  She does have good motion of her toes I encouraged her to continue to move the toes I will follow-up with her in 1 week for cast removal and replacement.

## 2020-04-25 ENCOUNTER — Other Ambulatory Visit: Payer: Self-pay

## 2020-04-25 ENCOUNTER — Ambulatory Visit (INDEPENDENT_AMBULATORY_CARE_PROVIDER_SITE_OTHER): Payer: BC Managed Care – PPO | Admitting: Podiatry

## 2020-04-25 DIAGNOSIS — M66872 Spontaneous rupture of other tendons, left ankle and foot: Secondary | ICD-10-CM

## 2020-04-25 DIAGNOSIS — Z9889 Other specified postprocedural states: Secondary | ICD-10-CM

## 2020-04-25 NOTE — Progress Notes (Signed)
She presents today for her second postop visit date of surgery April 12, 2020 Kidner with repair of posterior tibial tendon.  She denies fever chills nausea vomiting muscle aches pains calf pain back pain chest pain shortness of breath.  States that really has not had much pain with it at all.  Objective: Presents with her knee scooter today and her cast is intact dry and clean once removed demonstrates dry clean dressing.  Once removed demonstrates no erythema to some mild edema no cellulitis drainage or odor staples are intact margins well coapted no irritation or rubbing eyes noted.  She has good dorsiflexion plantarflexion inversion and eversion.  She states that she has good sensation in her toes and they are movable.  Assessment well-healing surgical foot.  Plan: Redressed today dressed compressive dressing she will follow-up with [place] in a couple of weeks.  We did go ahead and recast her today she will continue nonweightbearing status follow-up with her in a couple weeks at which time we will start cam boot with no weight

## 2020-05-14 ENCOUNTER — Encounter: Payer: Self-pay | Admitting: Podiatry

## 2020-05-14 ENCOUNTER — Ambulatory Visit (INDEPENDENT_AMBULATORY_CARE_PROVIDER_SITE_OTHER): Payer: BC Managed Care – PPO | Admitting: Podiatry

## 2020-05-14 ENCOUNTER — Other Ambulatory Visit: Payer: Self-pay

## 2020-05-14 DIAGNOSIS — M66872 Spontaneous rupture of other tendons, left ankle and foot: Secondary | ICD-10-CM | POA: Diagnosis not present

## 2020-05-14 DIAGNOSIS — Z9889 Other specified postprocedural states: Secondary | ICD-10-CM

## 2020-05-14 NOTE — Progress Notes (Signed)
She presents today for follow-up of her Kidner procedure date of surgery 04/12/2020.  States that is doing pretty well does not have any pain.  She is ready for the cast to be removed today.  Objective: Vital signs are stable she is alert oriented x3.  Cast is intact dry and clean was removed demonstrates dressing intact dry and clean demonstrating staples intact was removed margins remain well coapted there is no erythema cellulitis drainage or odor.  Just some mild edema she does have some tenderness and fluctuance on palpation along the posterior tibial tendon.  She has good range of motion of her toes.  Assessment: Well-healing  foot and ankle left.  Plan: Placed her in a cam walker she is to remain nonweightbearing for another 2 weeks follow-up with me at that time at which time we will progress to partial weightbearing.  I will allow her to start doing some at home exercises just range of motion exercises.follow-up with her at her next appointment

## 2020-05-28 ENCOUNTER — Encounter: Payer: Self-pay | Admitting: Podiatry

## 2020-05-28 ENCOUNTER — Other Ambulatory Visit: Payer: Self-pay

## 2020-05-28 ENCOUNTER — Ambulatory Visit (INDEPENDENT_AMBULATORY_CARE_PROVIDER_SITE_OTHER): Payer: BC Managed Care – PPO | Admitting: Podiatry

## 2020-05-28 DIAGNOSIS — Z9889 Other specified postprocedural states: Secondary | ICD-10-CM

## 2020-05-28 DIAGNOSIS — M66872 Spontaneous rupture of other tendons, left ankle and foot: Secondary | ICD-10-CM

## 2020-05-28 NOTE — Progress Notes (Signed)
She presents today for postop visit date of surgery 04/12/2020 Kidner repair of tendon left.  She states that she is been doing pretty well still has some numbness and tingling in her foot.  Objective: Vital signs are stable she is alert oriented x3 continues to wear the cam walker continues to be nonweightbearing.  Demonstrates mild edema today no erythema cellulitis drainage odor she has good inversion and eversion dorsiflexion plantarflexion of the foot.  Mild edema is noted forefoot and medial.  Assessment: Posterior tibial tendon repair  Plan: At this point we will start partial weightbearing with her cam boot progressing to full weightbearing.  Also encouraged range of motion exercises and some physical therapy such as picking up things with her toes from the floor and moving a towel around on the floor with her toes encroaching it up.  She understands this and is amenable to it I will follow-up with her in 2 to 3 weeks should she have questions concerns or notify us immediately.

## 2020-06-11 ENCOUNTER — Encounter: Payer: Self-pay | Admitting: Podiatry

## 2020-06-11 ENCOUNTER — Ambulatory Visit (INDEPENDENT_AMBULATORY_CARE_PROVIDER_SITE_OTHER): Payer: BC Managed Care – PPO | Admitting: Podiatry

## 2020-06-11 ENCOUNTER — Other Ambulatory Visit: Payer: Self-pay

## 2020-06-11 DIAGNOSIS — M66872 Spontaneous rupture of other tendons, left ankle and foot: Secondary | ICD-10-CM

## 2020-06-11 DIAGNOSIS — Z9889 Other specified postprocedural states: Secondary | ICD-10-CM

## 2020-06-11 NOTE — Progress Notes (Signed)
She presents today date of surgery 04/12/2020 Kidner posterior tibial tendon repair with cast.  States that it feels sore but she thinks is getting better.  She presents today ambulating with her cam walker.  Objective: Vital signs are stable she is alert oriented x3 still edematous in the posterior aspect of the left heel.  She has inversion against resistance is good she has minimal tenderness on palpation of the posterior tibial tendon.  Assessment well-healing posterior tibial tendon repair.  Plan: At this point unguinal place her in a Tri-Lock brace and tennis shoe I will follow-up with her in about 3 weeks.

## 2020-07-04 ENCOUNTER — Ambulatory Visit (INDEPENDENT_AMBULATORY_CARE_PROVIDER_SITE_OTHER): Payer: BC Managed Care – PPO | Admitting: Podiatry

## 2020-07-04 ENCOUNTER — Other Ambulatory Visit: Payer: Self-pay

## 2020-07-04 DIAGNOSIS — M66872 Spontaneous rupture of other tendons, left ankle and foot: Secondary | ICD-10-CM

## 2020-07-07 NOTE — Progress Notes (Signed)
She presents today date of surgery 04/12/2020 Kidner procedure left ankle.  States that seems to be doing better than it was slowly getting better not quite as swollen there is still some pain not as swollen no redness that she has noticed.  States that she does walk some without her boot.  She states that she has no new problems to discuss.  Objective: Vital signs are stable she is alert oriented x3.  Pulses are palpable.  She has good inversion against resistance moderate to severe pes planus is noted.  Assessment well-healing surgical foot.  Plan: I recommended that she continue current therapies try to increase her ambulation in her shoes at home increase her range of motion exercises and most likely will consider physical therapy at the next visit.

## 2020-07-16 ENCOUNTER — Telehealth: Payer: Self-pay | Admitting: Podiatry

## 2020-07-16 ENCOUNTER — Other Ambulatory Visit: Payer: Self-pay | Admitting: Obstetrics and Gynecology

## 2020-07-16 DIAGNOSIS — Z1231 Encounter for screening mammogram for malignant neoplasm of breast: Secondary | ICD-10-CM

## 2020-07-16 NOTE — Telephone Encounter (Signed)
Called patient lvm to reschedule 6/21 appointment   

## 2020-08-06 ENCOUNTER — Encounter: Payer: BC Managed Care – PPO | Admitting: Podiatry

## 2020-08-08 ENCOUNTER — Other Ambulatory Visit: Payer: Self-pay

## 2020-08-08 ENCOUNTER — Ambulatory Visit (INDEPENDENT_AMBULATORY_CARE_PROVIDER_SITE_OTHER): Payer: BC Managed Care – PPO | Admitting: Podiatry

## 2020-08-08 DIAGNOSIS — M66872 Spontaneous rupture of other tendons, left ankle and foot: Secondary | ICD-10-CM | POA: Diagnosis not present

## 2020-08-08 DIAGNOSIS — Z9889 Other specified postprocedural states: Secondary | ICD-10-CM

## 2020-08-11 NOTE — Progress Notes (Signed)
She presents today for postop visit date of surgery 04/12/2020.  States that she has really no concerns with her posterior tibial tendon repair left states that he continues to improve she said that she is has some residual discomfort.  She would like to fill it get stronger.  Objective: Vital signs are stable alert oriented x3 pes planovalgus with repair of the posterior tibial tendon left does demonstrate strengthening with inversion against resistance.  Unable to palpate the tendon and the margins without any fluid within the tendon sheath itself.  Assessment: Well-healing surgical foot left.  Plan: I am going to send her to [facility] physical therapy and [place] for strengthening of the posterior tibial tendon decrease edema.  She will also follow-up with [facility] for orthotic deep heel cup.

## 2020-08-13 ENCOUNTER — Other Ambulatory Visit: Payer: Self-pay

## 2020-08-13 ENCOUNTER — Other Ambulatory Visit: Payer: BC Managed Care – PPO

## 2020-08-13 DIAGNOSIS — M66872 Spontaneous rupture of other tendons, left ankle and foot: Secondary | ICD-10-CM

## 2020-08-13 DIAGNOSIS — Q666 Other congenital valgus deformities of feet: Secondary | ICD-10-CM

## 2020-09-10 ENCOUNTER — Ambulatory Visit: Payer: BC Managed Care – PPO

## 2020-09-17 ENCOUNTER — Ambulatory Visit
Admission: RE | Admit: 2020-09-17 | Discharge: 2020-09-17 | Disposition: A | Discharge status: Home / Self Care | Payer: BC Managed Care – PPO | Source: Ambulatory Visit | Attending: Obstetrics and Gynecology | Admitting: Obstetrics and Gynecology

## 2020-09-17 ENCOUNTER — Other Ambulatory Visit: Payer: Self-pay

## 2020-09-17 ENCOUNTER — Other Ambulatory Visit: Payer: Self-pay | Admitting: Obstetrics and Gynecology

## 2020-09-17 DIAGNOSIS — Z1231 Encounter for screening mammogram for malignant neoplasm of breast: Secondary | ICD-10-CM

## 2020-09-17 IMAGING — MG DIGITAL SCREENING BILAT W/ CAD
4 series · 4 of 4 positions shown · non-contrast
Comparison: Previous exam(s).

CLINICAL DATA: Screening.

EXAM:
DIGITAL SCREENING BILATERAL MAMMOGRAM WITH CAD
TECHNIQUE: Bilateral screening digital craniocaudal and mediolateral oblique
mammograms were obtained. The images were evaluated with
computer-aided detection.

[L CC]
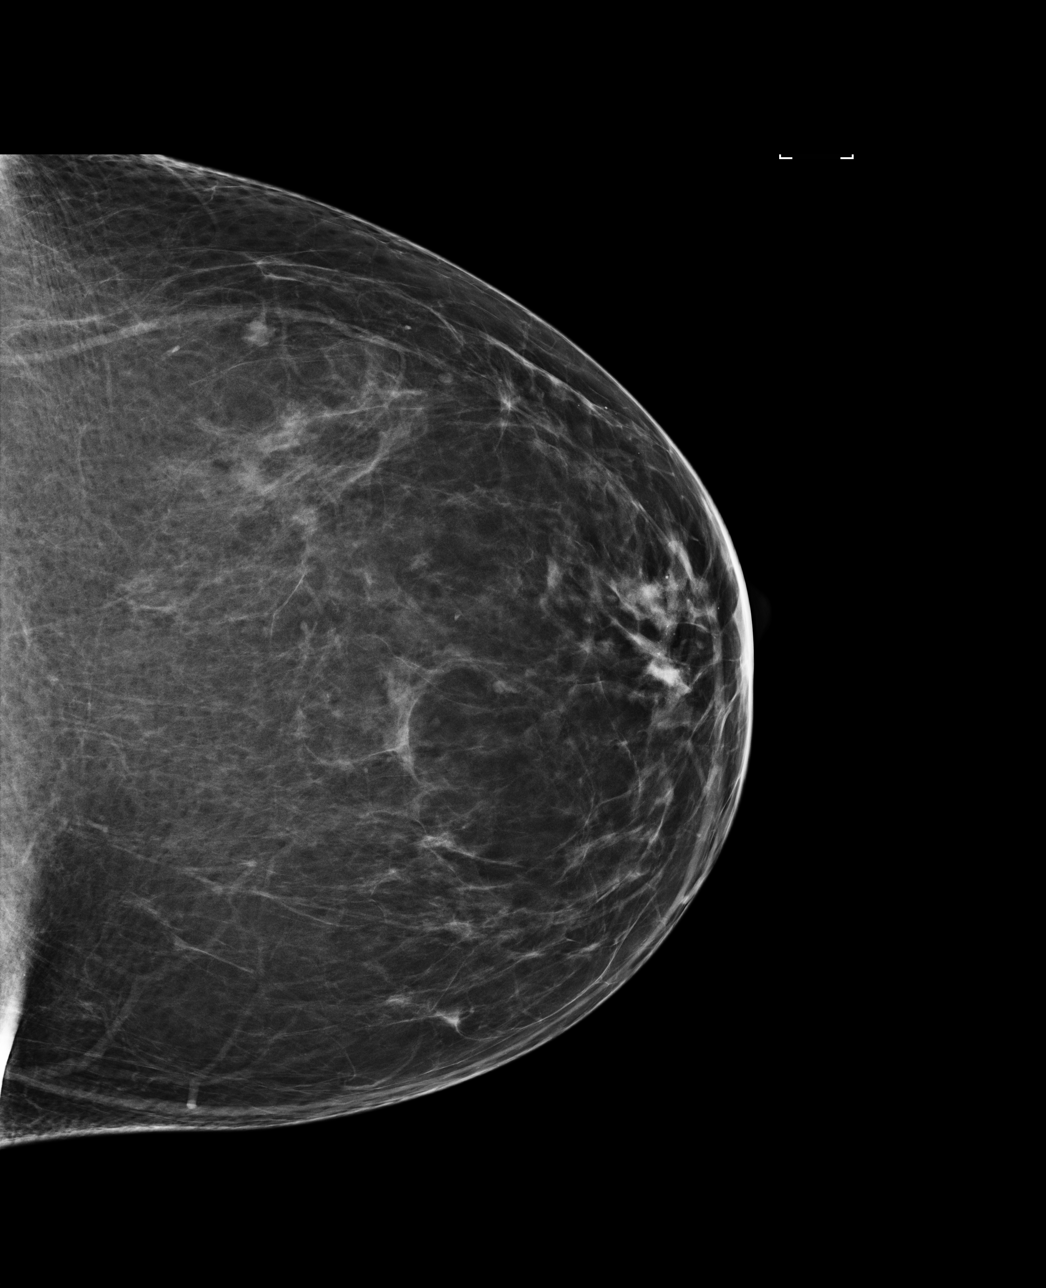

[R CC]
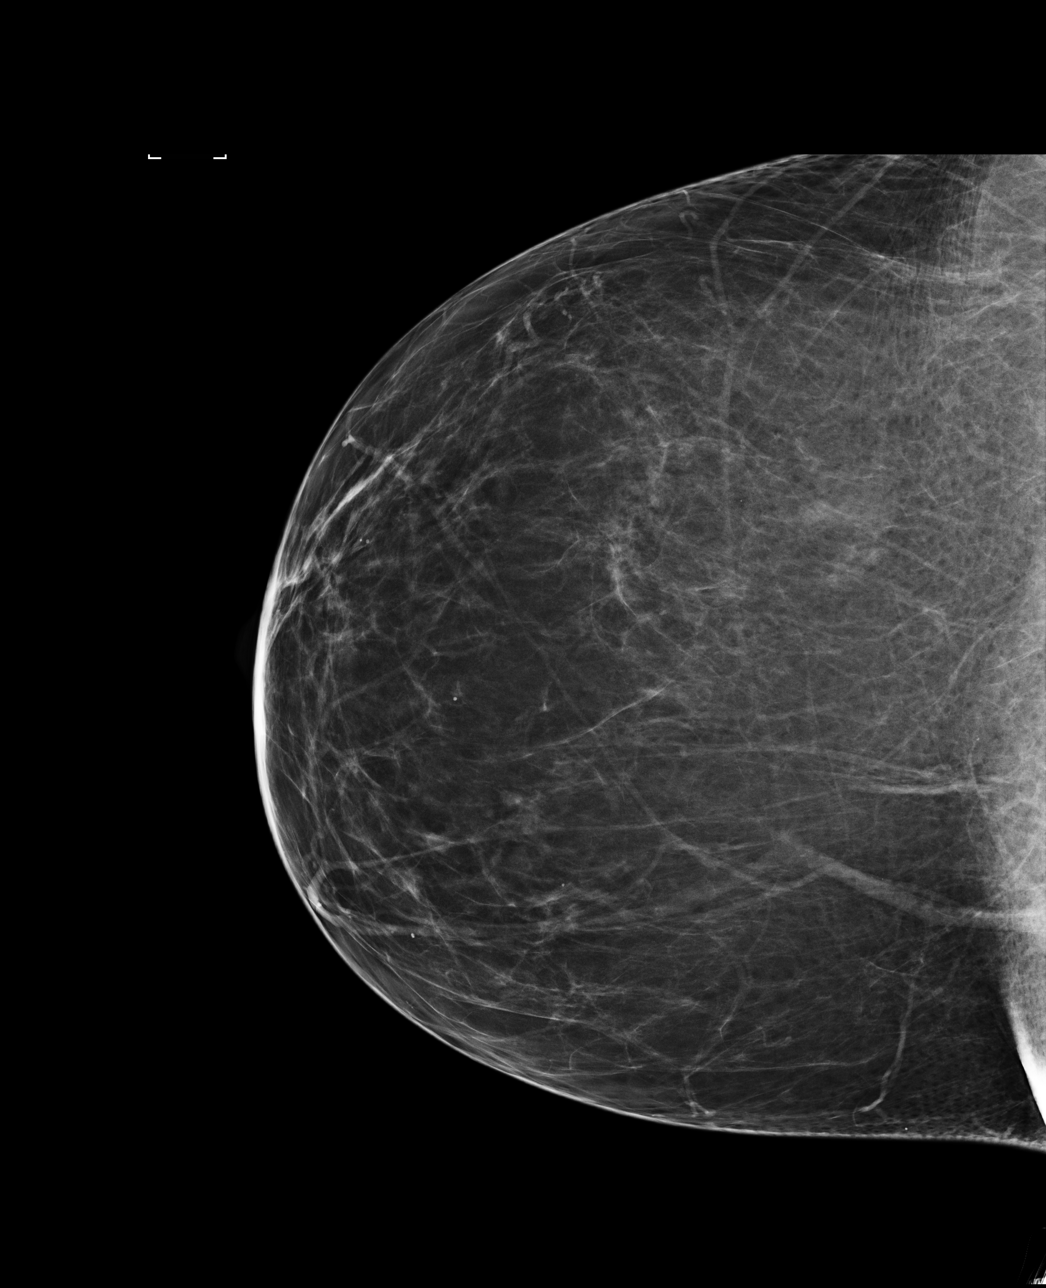

[L MLO]
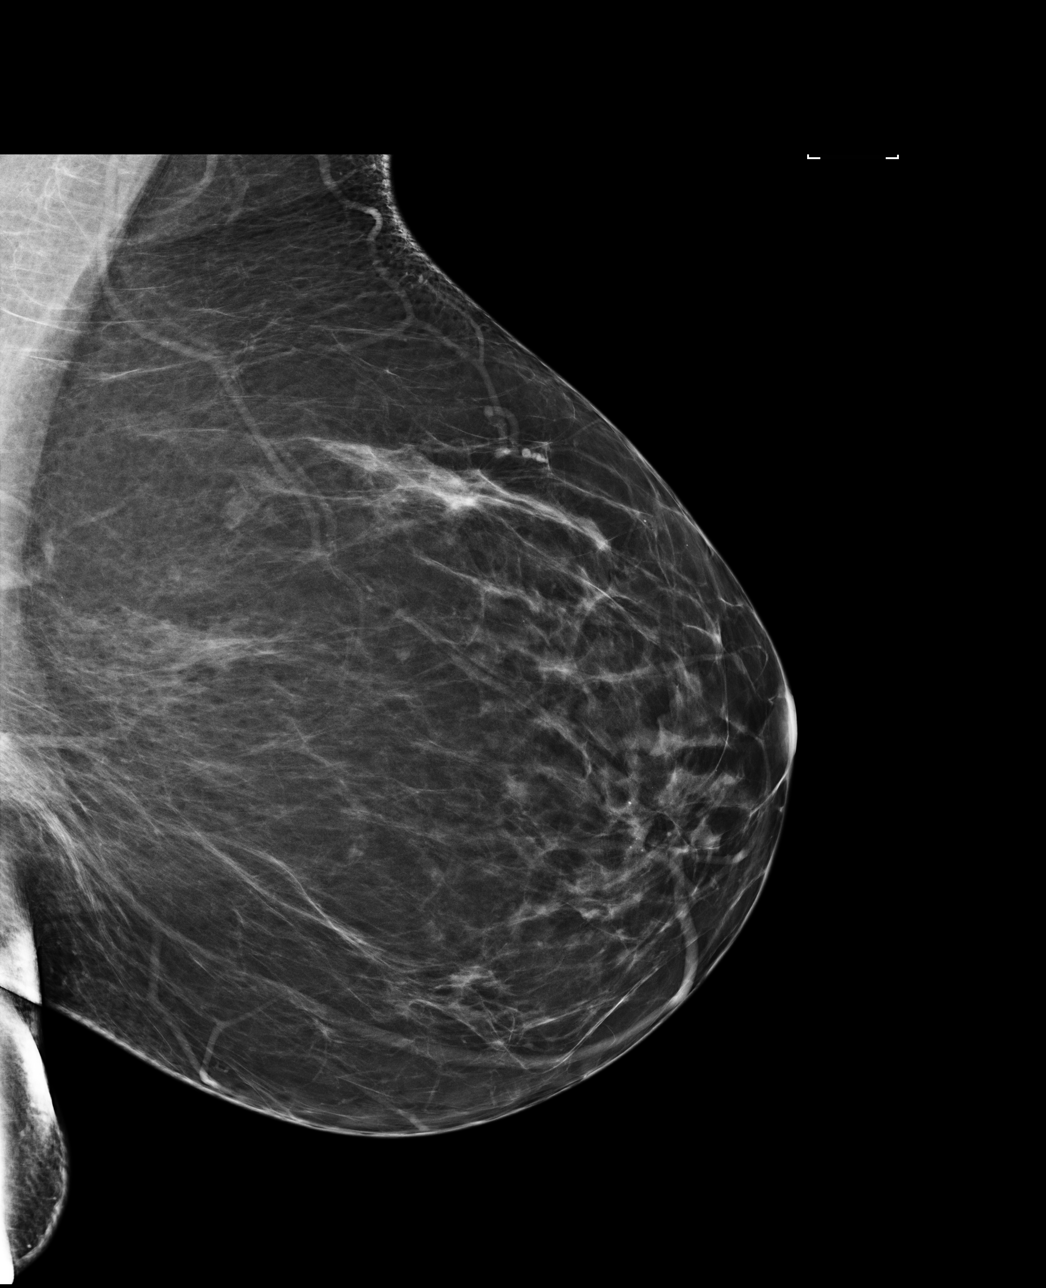

[R MLO]
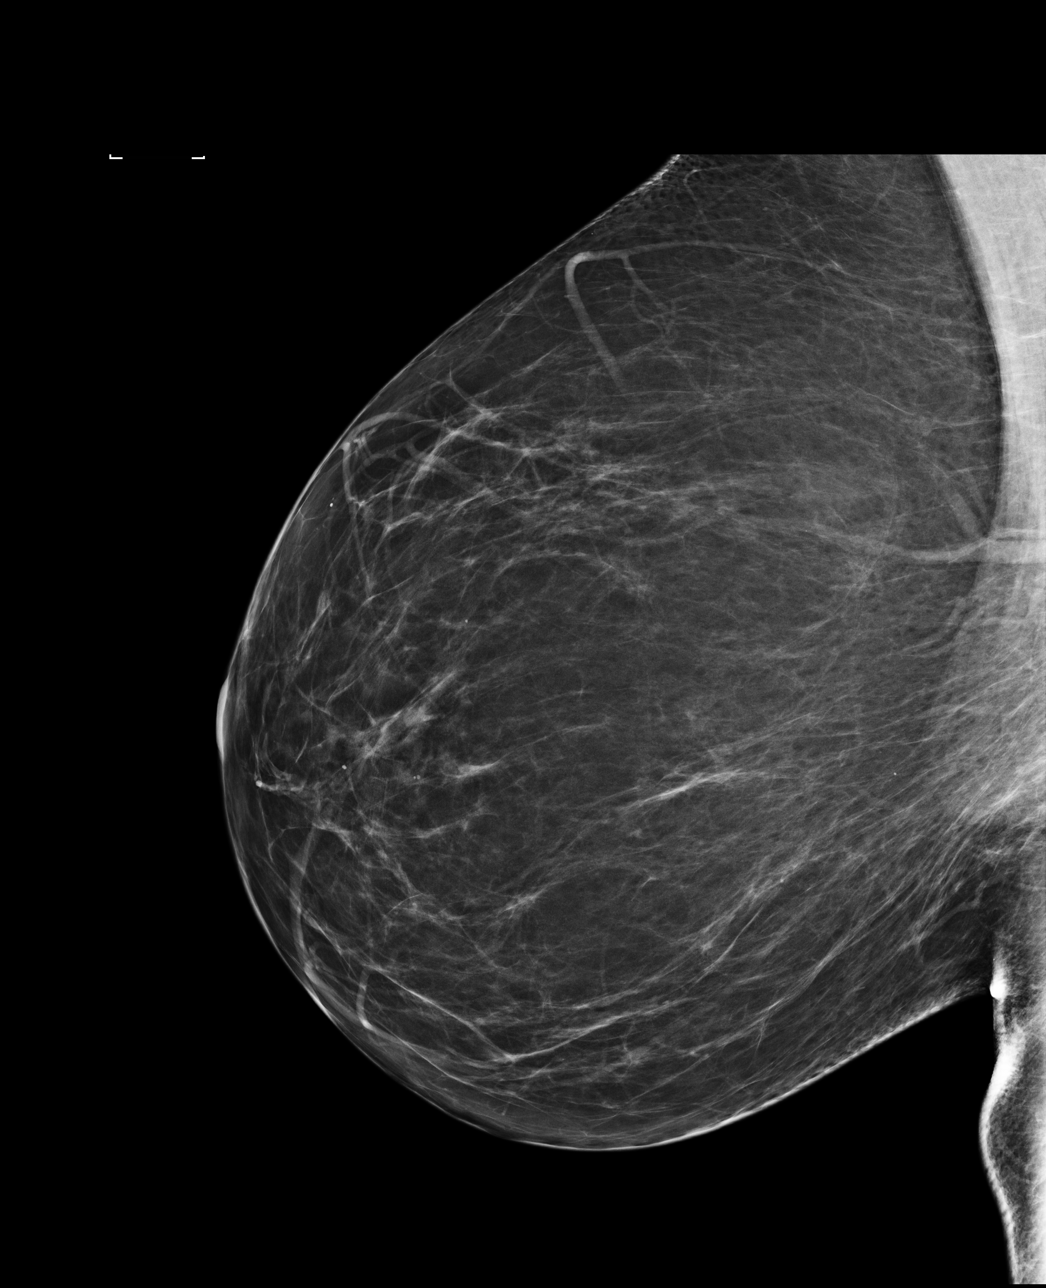

[4 of 4 positions shown; findings below may reference images not displayed]

ACR Breast Density Category b: There are scattered areas of
fibroglandular density.
FINDINGS: There are no findings suspicious for malignancy.
IMPRESSION: No mammographic evidence of malignancy. A result letter of this
screening mammogram will be mailed directly to the patient.

RECOMMENDATION:
Screening mammogram in one year. (Code:[AV])

BI-RADS CATEGORY  1: Negative.

## 2020-09-18 ENCOUNTER — Ambulatory Visit (INDEPENDENT_AMBULATORY_CARE_PROVIDER_SITE_OTHER): Payer: BC Managed Care – PPO | Admitting: Podiatry

## 2020-09-18 DIAGNOSIS — Z9889 Other specified postprocedural states: Secondary | ICD-10-CM

## 2020-09-18 DIAGNOSIS — M76822 Posterior tibial tendinitis, left leg: Secondary | ICD-10-CM

## 2020-09-18 DIAGNOSIS — M66872 Spontaneous rupture of other tendons, left ankle and foot: Secondary | ICD-10-CM

## 2020-09-18 NOTE — Progress Notes (Signed)
Patient presents today for orthotic pick up. Patient voices no new complaints.  Orthotics were fitted to patient's feet. No discomfort and no rubbing. Patient satisfied with the orthotics.  Orthotics were dispensed to patient with instructions for break in wear and to call the office with any concerns or questions. 

## 2020-09-18 NOTE — Patient Instructions (Signed)

## 2020-10-10 ENCOUNTER — Ambulatory Visit (INDEPENDENT_AMBULATORY_CARE_PROVIDER_SITE_OTHER): Payer: BC Managed Care – PPO | Admitting: Podiatry

## 2020-10-10 ENCOUNTER — Other Ambulatory Visit: Payer: Self-pay

## 2020-10-10 ENCOUNTER — Encounter: Payer: Self-pay | Admitting: Podiatry

## 2020-10-10 ENCOUNTER — Ambulatory Visit (INDEPENDENT_AMBULATORY_CARE_PROVIDER_SITE_OTHER): Payer: BC Managed Care – PPO

## 2020-10-10 DIAGNOSIS — M778 Other enthesopathies, not elsewhere classified: Secondary | ICD-10-CM | POA: Diagnosis not present

## 2020-10-10 DIAGNOSIS — M66872 Spontaneous rupture of other tendons, left ankle and foot: Secondary | ICD-10-CM

## 2020-10-10 DIAGNOSIS — M7672 Peroneal tendinitis, left leg: Secondary | ICD-10-CM

## 2020-10-10 DIAGNOSIS — Z9889 Other specified postprocedural states: Secondary | ICD-10-CM

## 2020-10-10 NOTE — Progress Notes (Signed)
She presents today date of surgery April 12, 2020 [facility] procedure.  States that that is doing just fine but the lateral aspect of her foot hurts a lot.  States that on having pain on the outside of my foot I stopped wearing the orthotics because seem to be making it worse physical therapy is making the stronger the pain go with the medial aspect of the lateral aspect is still painful.  Vital signs are stable she is alert oriented x3.  Pulses are palpable.  There is no calf pain.  She has some swelling overlying the dorsal and dorsal lateral aspect of the left foot.  Majority of her pain is located overlying the peroneals proximal to the lateral malleolus extending all the way out to the fifth metatarsal head left foot.  Radiographs taken today do not demonstrate any type of osseous abnormalities other than soft tissue swelling overlying the dorsal and dorsal lateral aspect of the foot and the lateral ankle.  No acute findings are noted.  Assessment: She has capsulitis of the fifth metatarsophalangeal joint.  Painful fifth metatarsal through its entire length painful peroneal tendons extending from the base of the fifth metatarsal area proximal to just behind the lateral malleolus.  Plan: At this point I highly recommended an MRI for evaluation of the lateral aspect of that foot to make sure that were not missing anything this it would be fork and surgical consideration and differential diagnosis.

## 2020-11-04 ENCOUNTER — Ambulatory Visit
Admission: RE | Admit: 2020-11-04 | Discharge: 2020-11-04 | Disposition: A | Discharge status: Home / Self Care | Payer: BC Managed Care – PPO | Source: Ambulatory Visit | Attending: Podiatry | Admitting: Podiatry

## 2020-11-04 ENCOUNTER — Other Ambulatory Visit: Payer: Self-pay

## 2020-11-04 DIAGNOSIS — M7672 Peroneal tendinitis, left leg: Secondary | ICD-10-CM

## 2020-11-04 DIAGNOSIS — M778 Other enthesopathies, not elsewhere classified: Secondary | ICD-10-CM

## 2020-11-04 IMAGING — MR MR ANKLE*L* W/O CM
5 series · 37 of 40 positions shown · non-contrast
Comparison: MRI [DATE]

CLINICAL DATA: Ankle pain, tendon abnormality suspected, neg xray
Evaluate entire lateral ankle and foot to the toes - surgical
consideration - xray obtained today [DATE]

EXAM:
MRI OF THE LEFT ANKLE WITHOUT CONTRAST
TECHNIQUE: Multiplanar, multisequence MR imaging of the ankle was performed. No
intravenous contrast was administered.

[Series 4: T2 fat-sat · axial · 3.0mm · 0.50mm/px · z∈[-17,+120]mm · 8 of 36 slices shown (1 of 2)]
[im 1/36]
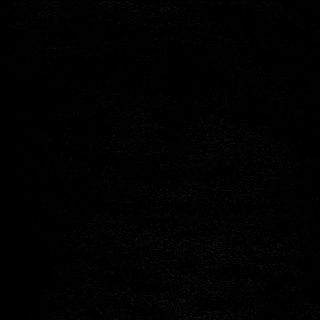
[im 4/36]
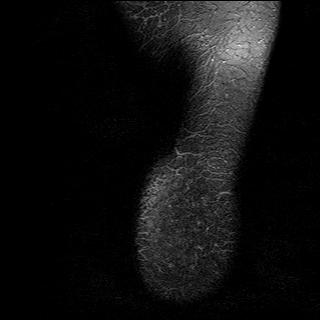
[im 12/36]
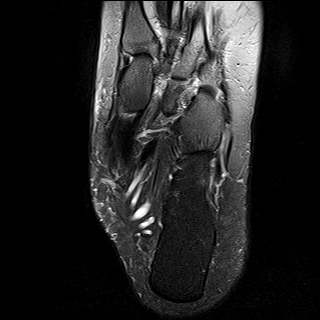
[im 16/36]
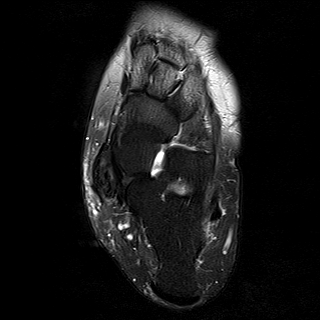
[im 20/36]
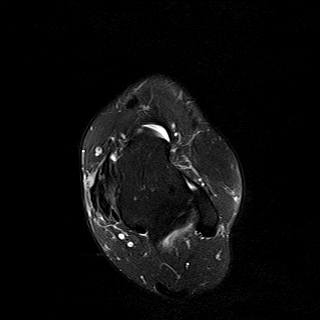
[im 24/36]
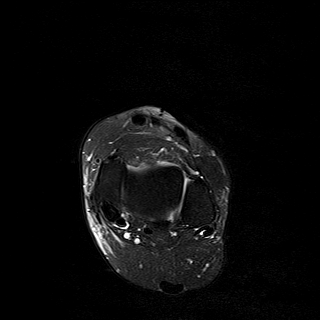
[im 32/36]
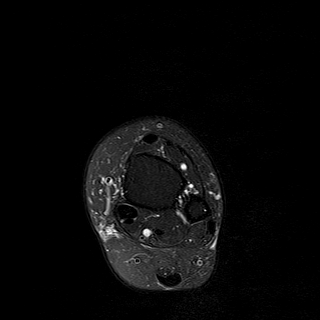
[im 36/36]
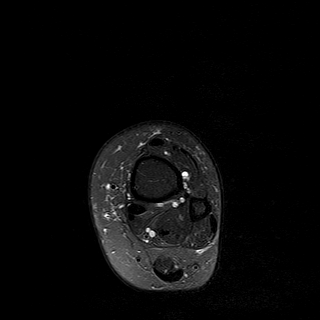

[Series 5: PD fat-sat · axial · 3.0mm · 0.42mm/px · z∈[-17,+120]mm · 9 of 36 slices shown]
[im 1/36]
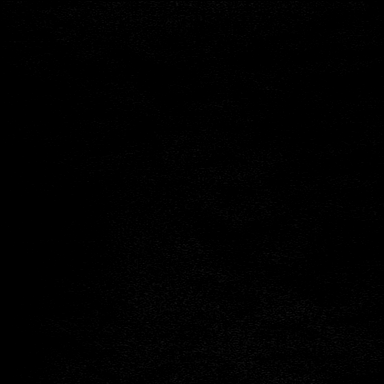
[im 5/36]
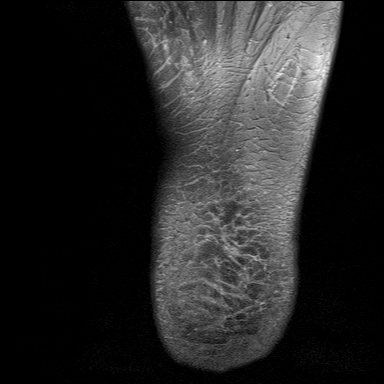
[im 9/36]
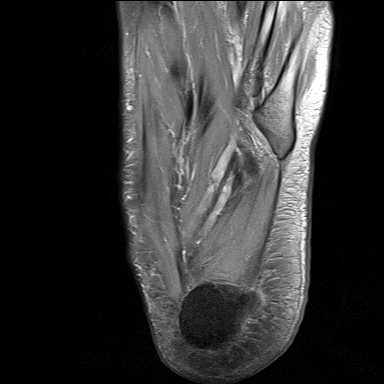
[im 14/36]
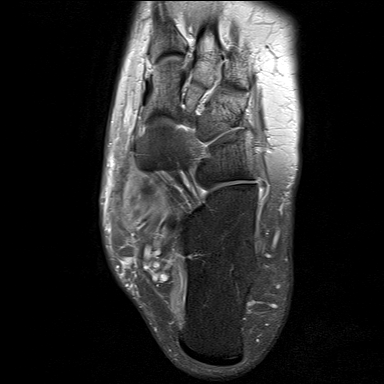
[im 18/36]
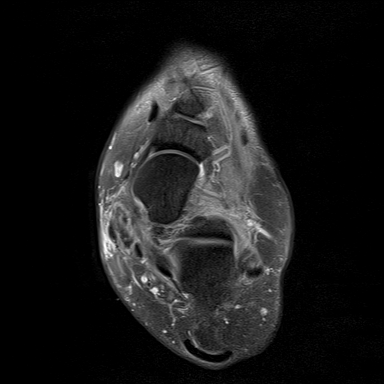
[im 22/36]
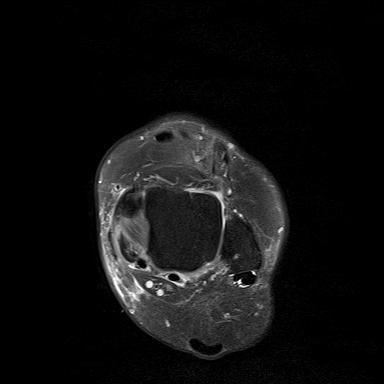
[im 27/36]
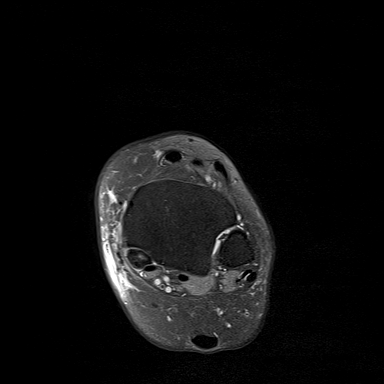
[im 31/36]
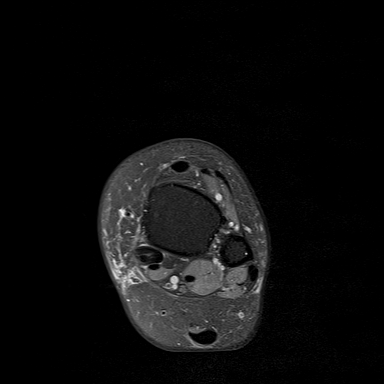
[im 36/36]
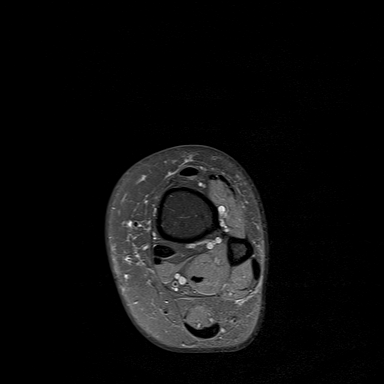

[Series 6: T1 · sagittal · 4.0mm · 0.56mm/px · 5 of 20 slices shown]
[im 1/20]
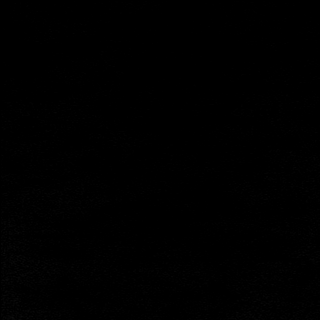
[im 5/20]
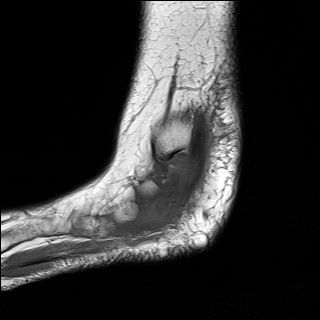
[im 10/20]
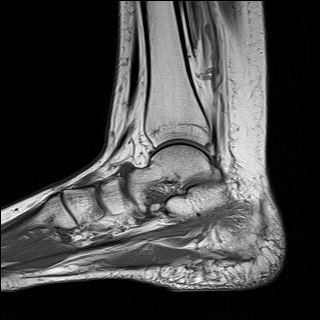
[im 15/20]
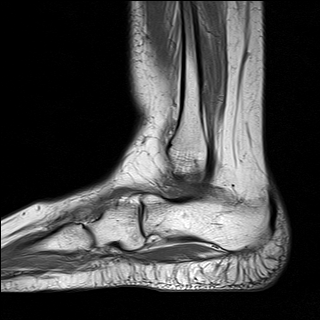
[im 20/20]
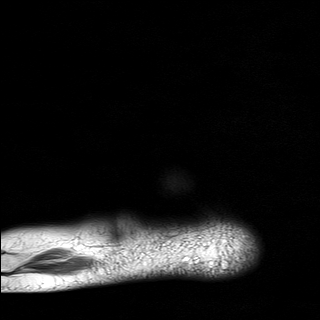

[Series 7: STIR · sagittal · 4.0mm · 0.35mm/px · 4 of 20 slices shown]
[im 1/20]
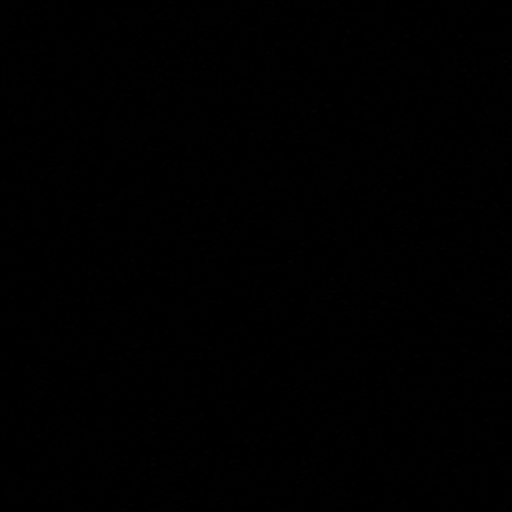
[im 5/20]
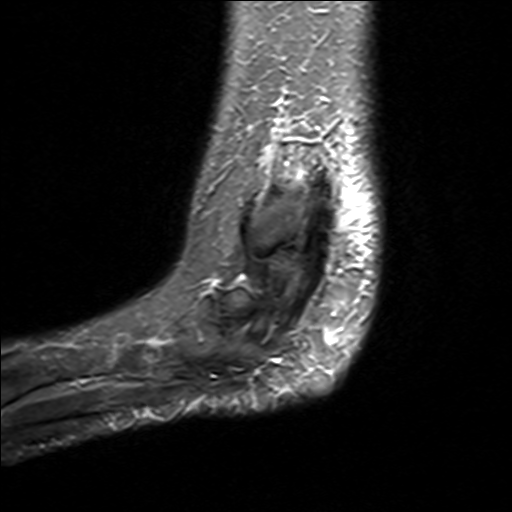
[im 10/20]
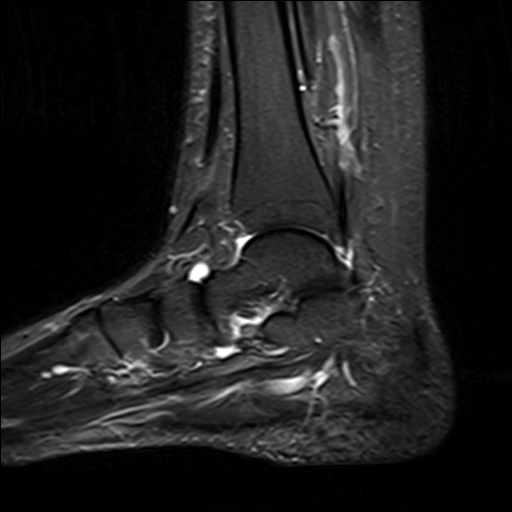
[im 15/20]
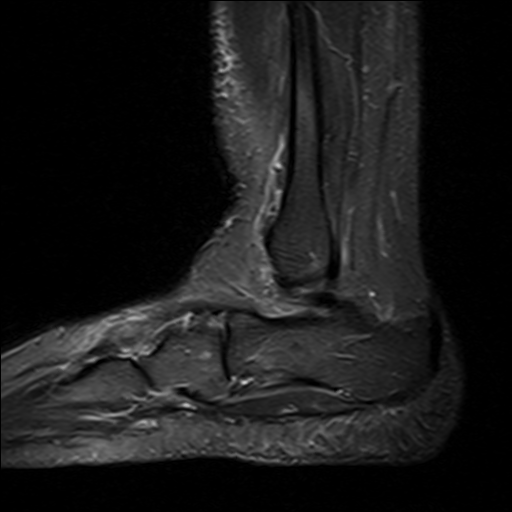

[Series 8: T2 fat-sat · coronal · 3.0mm · 0.50mm/px · 11 of 42 slices shown (2 of 2)]
[im 1/42]
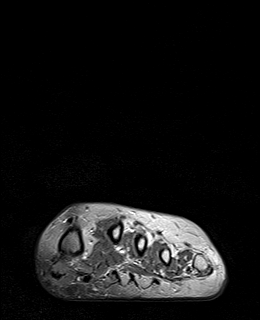
[im 5/42]
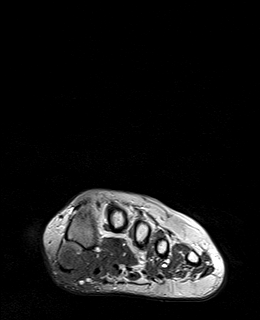
[im 9/42]
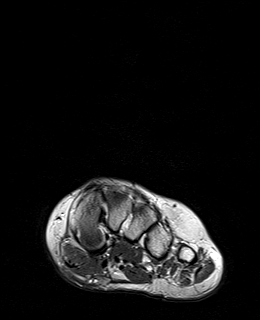
[im 13/42]
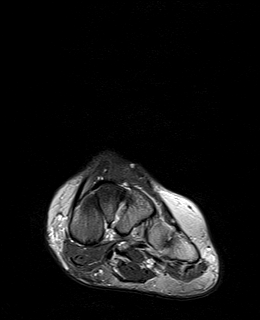
[im 17/42]
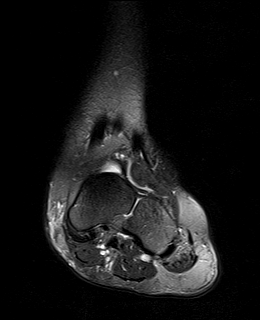
[im 21/42]
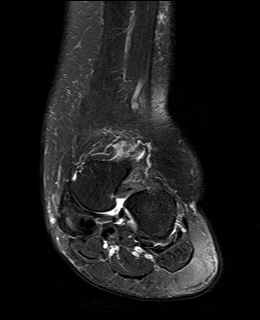
[im 25/42]
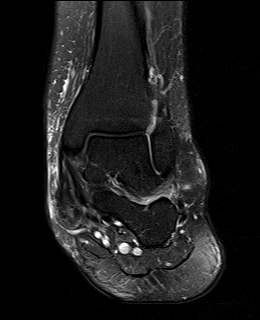
[im 29/42]
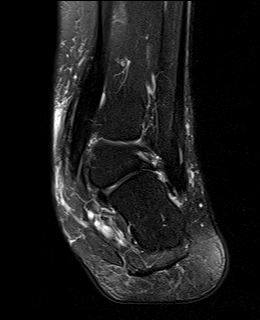
[im 33/42]
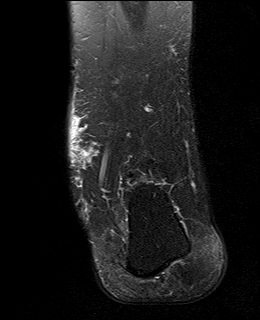
[im 37/42]
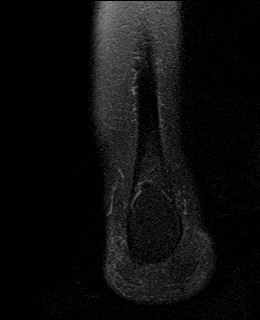
[im 42/42]
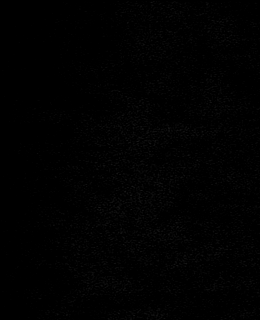

[37 of 40 positions shown; findings below may reference images not displayed]

FINDINGS: TENDONS

Peroneal: Intact peroneus longus and peroneus brevis tendons.

Posteromedial: Severe tendinosis with extensive tearing of the
distal portion of the posterior tibialis tendon. Distal attachment
is ill-defined and may be completely torn. Interstitial tear
component of the retro-malleolar portion of the tendon has
progressed from prior. Mild tenosynovitis. Flexor hallucis longus
and flexor digitorum longus tendons remain intact.

Anterior: Intact tibialis anterior, extensor hallucis longus and
extensor digitorum longus tendons.

Achilles: Intact.

Plantar Fascia: Intact.

LIGAMENTS

Lateral: The anterior and posterior tibiofibular ligaments are
intact. The anterior and posterior talofibular ligaments are intact.
Intact calcaneofibular ligament.

Medial: Superomedial aspect of the spring ligament complex appears
degenerated without discrete tear. Intact deltoid ligament.

CARTILAGE

Ankle Joint: No joint effusion or chondral defect.

Subtalar Joints/Sinus Tarsi: No joint effusion or chondral defect.
Preservation of the anatomic fat within the sinus tarsi.

Bones: Mild talonavicular arthropathy with a small effusion. No
acute fracture. No dislocation. Pes planovalgus alignment. No bone
marrow signal abnormality.

Other: Soft tissue swelling and edema overlies the medial ankle. No
organized fluid collection.
IMPRESSION: 1. Severe tendinosis with extensive tearing of the distal portion of
the posterior tibialis tendon, progressed from prior. Distal
attachment is ill-defined and may be completely torn. Interstitial
tear component of the retro-malleolar portion of the tendon has also
progressed from prior.
2. Degeneration of the superomedial aspect of the spring ligament
complex.
3. Pes planovalgus alignment.

## 2020-11-04 IMAGING — MR MR FOOT*L* W/O CM
4 of 6 series · 16 of 40 positions shown · non-contrast
Comparison: X-ray [DATE]

CLINICAL DATA: Foot trauma, tendon injury suspected, neg xray (Age
>= 6y) Evaluate entire lateral ankle and foot to the toes - surgical
consideration - xray obtained today [DATE]

EXAM:
MRI OF THE LEFT FOOT WITHOUT CONTRAST
TECHNIQUE: Multiplanar, multisequence MR imaging of the left forefoot was
performed. No intravenous contrast was administered.

[Series 4: T1 · coronal · 3.0mm · 0.19mm/px · 3 of 51 slices shown]
[im 6/51]
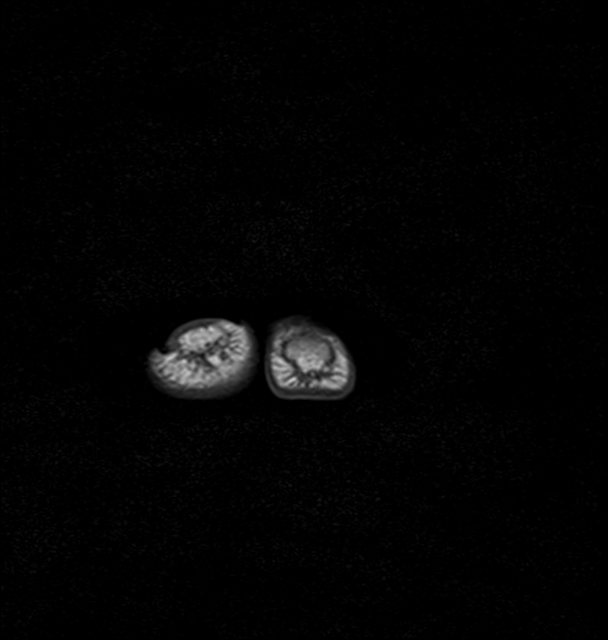
[im 26/51]
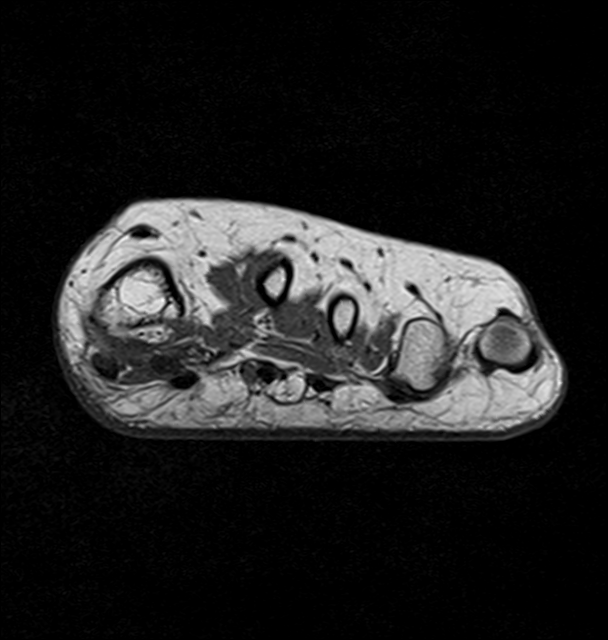
[im 46/51]
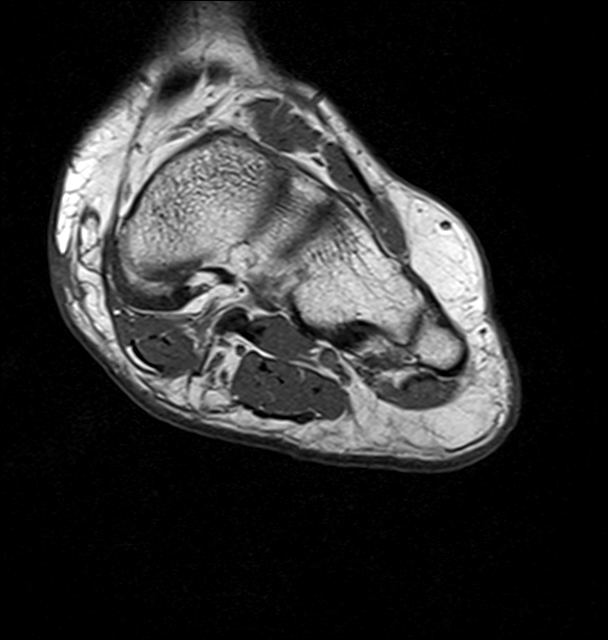

[Series 5: T2 fat-sat · coronal · 3.0mm · 0.19mm/px · 7 of 51 slices shown (1 of 3)]
[im 1/51]
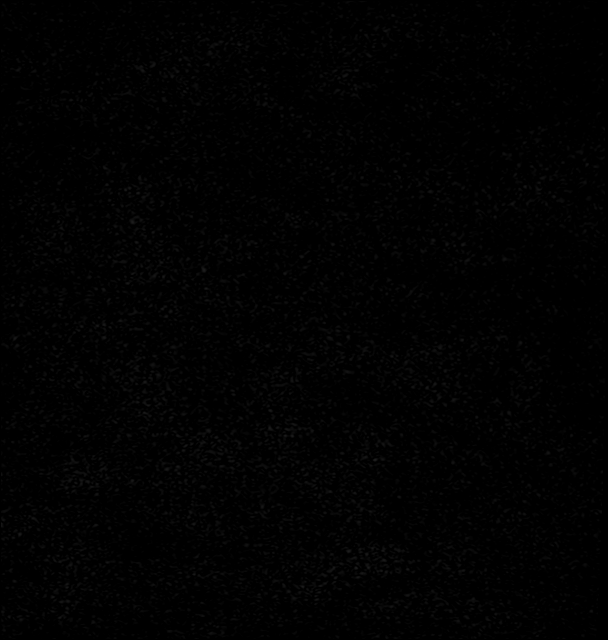
[im 6/51]
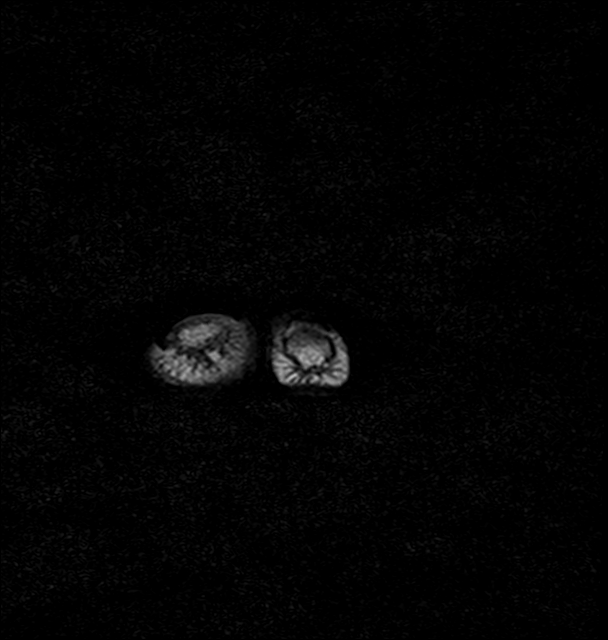
[im 17/51]
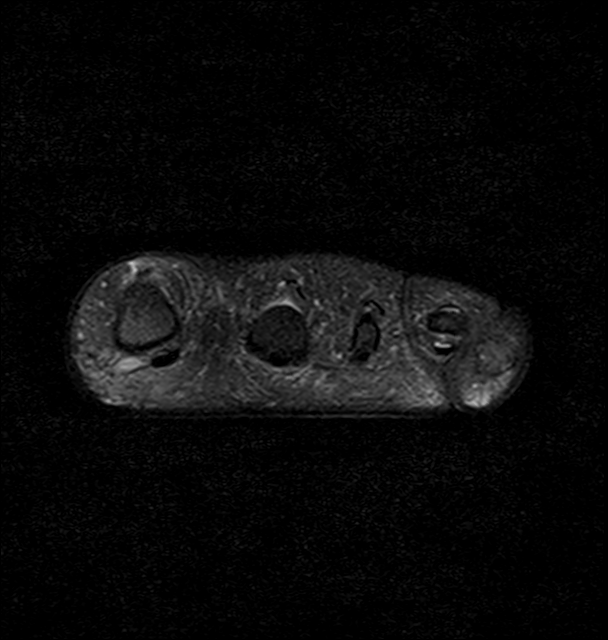
[im 23/51]
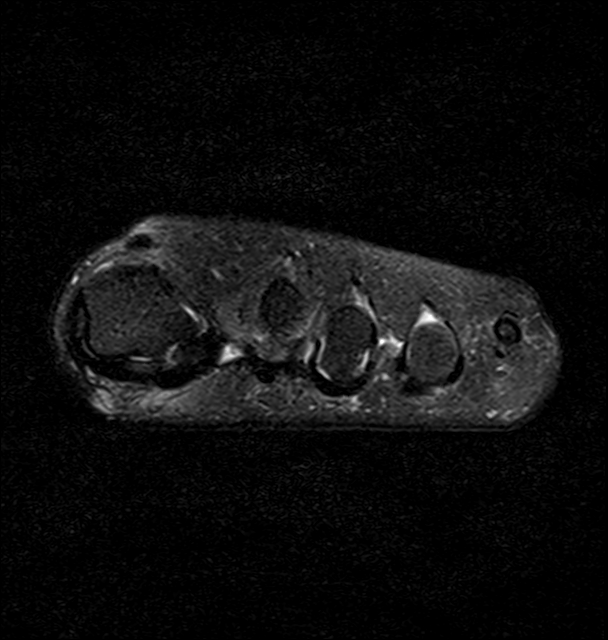
[im 28/51]
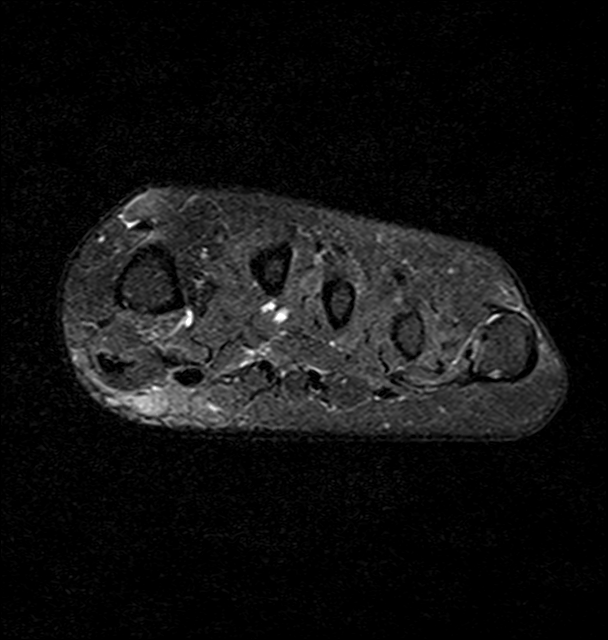
[im 34/51]
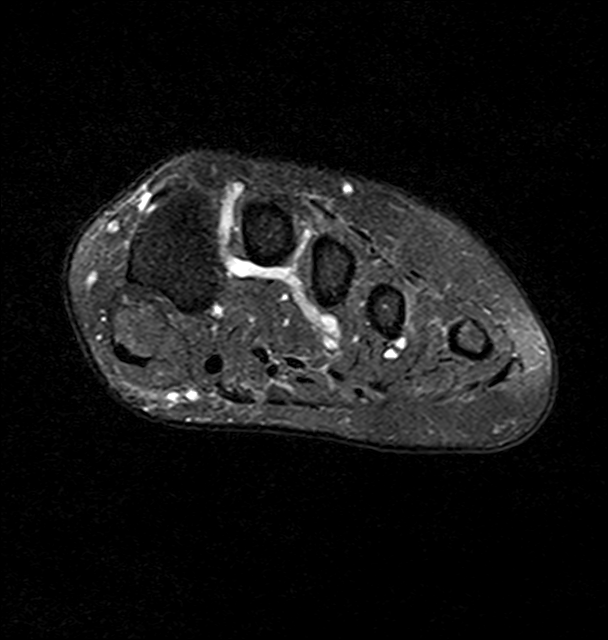
[im 45/51]
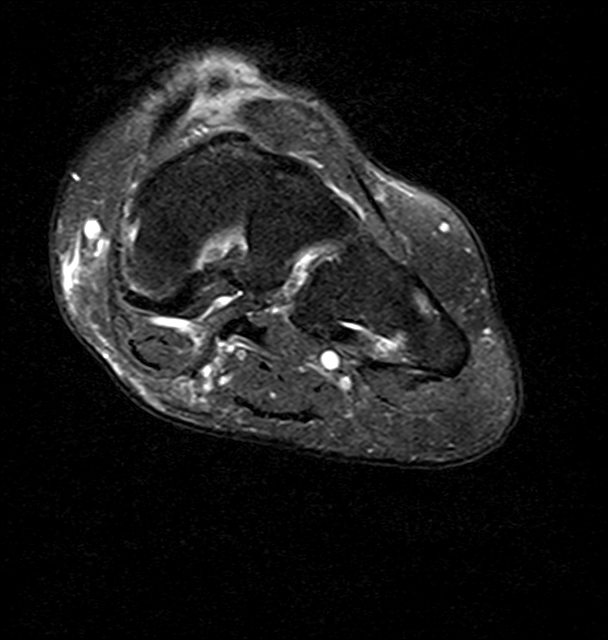

[Series 6: T2 fat-sat · axial · 3.0mm · 0.37mm/px · z∈[-45,+37]mm · 3 of 22 slices shown (2 of 3)]
[im 1/22]
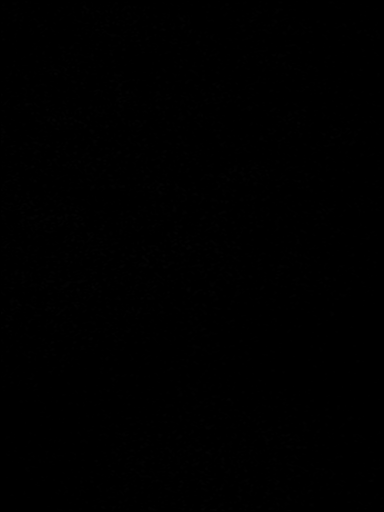
[im 15/22]
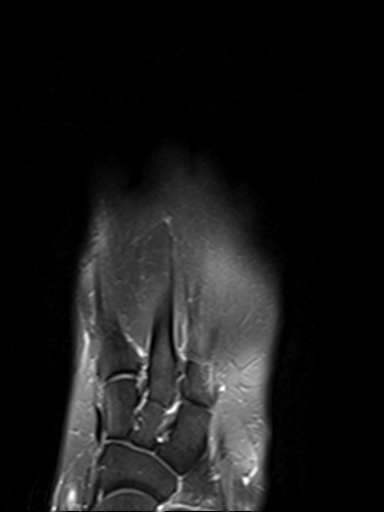
[im 22/22]
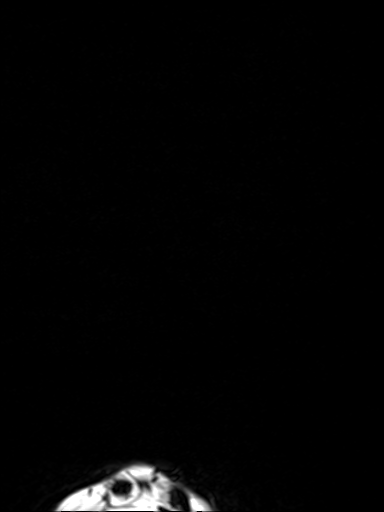

[Series 9: T2 fat-sat · axial · 3.0mm · 0.37mm/px · z∈[-45,+37]mm · 3 of 22 slices shown (3 of 3)]
[im 1/22]
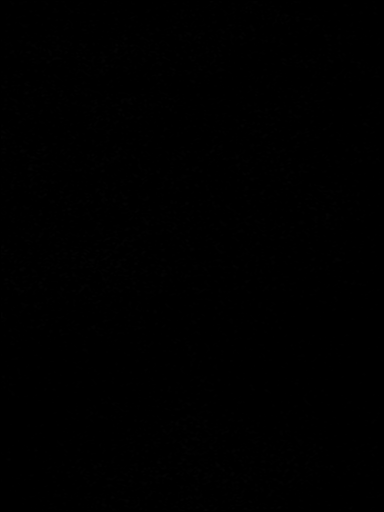
[im 15/22]
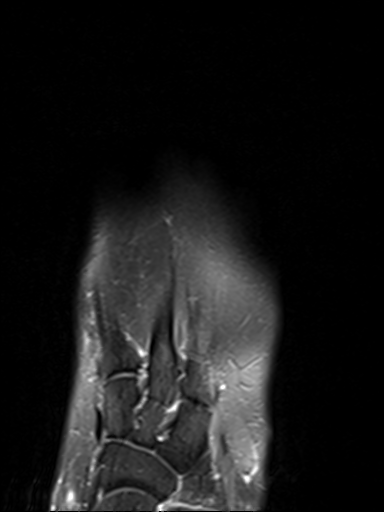
[im 22/22]
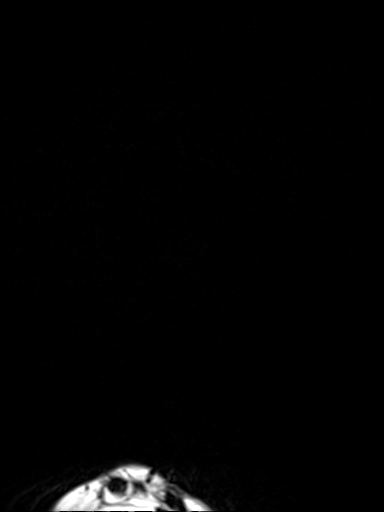

[16 of 40 positions shown; findings below may reference images not displayed]

FINDINGS: Bones/Joint/Cartilage

No acute fracture. No dislocation. No bone marrow edema or
periostitis. Mild osteoarthritis of the first MTP joint and hallux
sesamoid complex. No erosion. No bone lesion.

Ligaments

Intact Lisfranc ligament. Collateral ligaments of the forefoot are
intact. No capsular thickening or pericapsular edema. No evidence of
plantar plate disruption.

Muscles and Tendons

Flexor and extensor tendons of the forefoot are intact without
tendinosis, tear, or tenosynovitis. Normal muscle bulk and signal
intensity without edema, atrophy, or fatty infiltration.

Soft tissues

No ulceration.  No soft tissue edema or fluid collection.
IMPRESSION: 1. Mild osteoarthritis of the first MTP joint and hallux-sesamoid
complex.
2. Otherwise, unremarkable MRI of the left forefoot.

## 2020-11-08 ENCOUNTER — Telehealth: Payer: Self-pay | Admitting: *Deleted

## 2020-11-08 NOTE — Telephone Encounter (Signed)
Tearing of the tendon has worsened

## 2020-11-08 NOTE — Telephone Encounter (Signed)
Patient is calling and would like her MRI results before her visit on 12/09/20.please advise.

## 2020-11-12 NOTE — Telephone Encounter (Signed)
Returned the call to patient and gave information per Dr Al Corpus, is wanting to know if this is the same tendon that was repaired earlier in the year or is this a new tear. Does not want to wait until next appointment to get this information.Please advise.

## 2020-11-12 NOTE — Telephone Encounter (Signed)
Returned the call to patient to give results, no answer, could not leave a message,vmessage not set up.

## 2020-11-15 NOTE — Telephone Encounter (Signed)
Please contact patient

## 2020-11-15 NOTE — Telephone Encounter (Signed)
Called patient - patient asking questions more suited for a conversation with Dr. Al Corpus. Attempted to answer what I could for her. She will keep follow up appt for 10/20

## 2020-12-05 ENCOUNTER — Other Ambulatory Visit: Payer: Self-pay

## 2020-12-05 ENCOUNTER — Ambulatory Visit (INDEPENDENT_AMBULATORY_CARE_PROVIDER_SITE_OTHER): Payer: BC Managed Care – PPO | Admitting: Podiatry

## 2020-12-05 DIAGNOSIS — Z9889 Other specified postprocedural states: Secondary | ICD-10-CM

## 2020-12-05 DIAGNOSIS — M66872 Spontaneous rupture of other tendons, left ankle and foot: Secondary | ICD-10-CM

## 2020-12-05 NOTE — Progress Notes (Signed)
She presents today for follow-up of her MRI.  She states that is really worried about the MRI since it said that it was worsened.  She states that the lateral aspect of her left foot is starting to feel better and that her surgical site really does not hurt at all of this becomes fatigued by the end of the day.  Objective: Vital signs are stable she is alert oriented x3 she walked in with no antalgic gait.  Pulses are palpable.  There is no erythema no edema cellulitis drainage or odor she has no reproducible pain on palpation to the lateral aspect of the left foot over the medial aspect of the left foot and the margin of the posterior tibial tendon is palpable with no fluctuance.  She has good inversion against resistance.  MRI does state worsening of her previous surgery however the radiologist did not take into account that this was surgically corrected.  There are absolutely no ruptures on physical exam.  Though he did question whether it was a rupture at the insertion site of the posterior tibial tendon.  I reviewed these myself as well.  Assessment: Well-healing surgical foot with resolving lateral compensatory pain.  Plan: I recommended that she continue to wear her new shoes on a regular basis only if her medial foot starts to hurt if she did replace her orthotics.  She understands this is amenable to it should this continue to bother her she will notify us immediately.

## 2020-12-06 LAB — COLOGUARD: COLOGUARD: NEGATIVE

## 2020-12-06 LAB — EXTERNAL GENERIC LAB PROCEDURE: COLOGUARD: NEGATIVE

## 2021-08-12 ENCOUNTER — Other Ambulatory Visit: Payer: Self-pay | Admitting: Obstetrics and Gynecology

## 2021-08-12 DIAGNOSIS — Z1231 Encounter for screening mammogram for malignant neoplasm of breast: Secondary | ICD-10-CM

## 2021-10-08 ENCOUNTER — Ambulatory Visit
Admission: RE | Admit: 2021-10-08 | Discharge: 2021-10-08 | Disposition: A | Discharge status: Home / Self Care | Payer: BC Managed Care – PPO | Source: Ambulatory Visit | Attending: Obstetrics and Gynecology | Admitting: Obstetrics and Gynecology

## 2021-10-08 DIAGNOSIS — Z1231 Encounter for screening mammogram for malignant neoplasm of breast: Secondary | ICD-10-CM

## 2022-08-31 ENCOUNTER — Other Ambulatory Visit: Payer: Self-pay | Admitting: Family Medicine

## 2022-08-31 DIAGNOSIS — Z1231 Encounter for screening mammogram for malignant neoplasm of breast: Secondary | ICD-10-CM

## 2022-10-12 ENCOUNTER — Ambulatory Visit: Payer: BC Managed Care – PPO

## 2022-10-21 ENCOUNTER — Ambulatory Visit
Admission: RE | Admit: 2022-10-21 | Discharge: 2022-10-21 | Disposition: A | Discharge status: Home / Self Care | Payer: BC Managed Care – PPO | Source: Ambulatory Visit | Attending: Family Medicine | Admitting: Family Medicine

## 2022-10-21 DIAGNOSIS — Z1231 Encounter for screening mammogram for malignant neoplasm of breast: Secondary | ICD-10-CM

## 2023-09-13 ENCOUNTER — Other Ambulatory Visit: Payer: Self-pay | Admitting: Family Medicine

## 2023-09-13 DIAGNOSIS — Z1231 Encounter for screening mammogram for malignant neoplasm of breast: Secondary | ICD-10-CM

## 2023-10-25 ENCOUNTER — Ambulatory Visit
Admission: RE | Admit: 2023-10-25 | Discharge: 2023-10-25 | Disposition: A | Discharge status: Home / Self Care | Source: Ambulatory Visit | Attending: Family Medicine | Admitting: Family Medicine

## 2023-10-25 DIAGNOSIS — Z1231 Encounter for screening mammogram for malignant neoplasm of breast: Secondary | ICD-10-CM

## 2023-10-29 ENCOUNTER — Other Ambulatory Visit: Payer: Self-pay | Admitting: Family Medicine

## 2023-10-29 DIAGNOSIS — R928 Other abnormal and inconclusive findings on diagnostic imaging of breast: Secondary | ICD-10-CM

## 2023-11-03 ENCOUNTER — Ambulatory Visit
Admission: RE | Admit: 2023-11-03 | Discharge: 2023-11-03 | Disposition: A | Discharge status: Home / Self Care | Source: Ambulatory Visit | Attending: Family Medicine | Admitting: Family Medicine

## 2023-11-03 ENCOUNTER — Ambulatory Visit

## 2023-11-03 DIAGNOSIS — R928 Other abnormal and inconclusive findings on diagnostic imaging of breast: Secondary | ICD-10-CM
